# Patient Record
Sex: Male | Born: 1955 | Race: Black or African American | Hispanic: No | Marital: Married | Smoking: Current every day smoker
Health system: Southern US, Community
[De-identification: ages and names within clinical notes are randomized; demographics above are authoritative.]

## PROBLEM LIST (undated history)

## (undated) DIAGNOSIS — K5090 Crohn's disease, unspecified, without complications: Secondary | ICD-10-CM

## (undated) DIAGNOSIS — I10 Essential (primary) hypertension: Secondary | ICD-10-CM

## (undated) DIAGNOSIS — I639 Cerebral infarction, unspecified: Secondary | ICD-10-CM

## (undated) DIAGNOSIS — E785 Hyperlipidemia, unspecified: Secondary | ICD-10-CM

## (undated) HISTORY — PX: OTHER SURGICAL HISTORY: SHX169

## (undated) HISTORY — DX: Hyperlipidemia, unspecified: E78.5

## (undated) HISTORY — DX: Crohn's disease, unspecified, without complications: K50.90

---

## 2000-06-01 ENCOUNTER — Encounter: Payer: Self-pay | Admitting: Internal Medicine

## 2000-06-01 ENCOUNTER — Ambulatory Visit (HOSPITAL_COMMUNITY): Admission: RE | Admit: 2000-06-01 | Discharge: 2000-06-01 | Payer: Self-pay | Admitting: Internal Medicine

## 2000-11-14 ENCOUNTER — Ambulatory Visit (HOSPITAL_COMMUNITY): Admission: RE | Admit: 2000-11-14 | Discharge: 2000-11-14 | Payer: Self-pay | Admitting: General Surgery

## 2003-10-04 ENCOUNTER — Emergency Department (HOSPITAL_COMMUNITY): Admission: EM | Admit: 2003-10-04 | Discharge: 2003-10-04 | Payer: Self-pay | Admitting: Emergency Medicine

## 2003-10-06 ENCOUNTER — Ambulatory Visit (HOSPITAL_COMMUNITY): Admission: RE | Admit: 2003-10-06 | Discharge: 2003-10-06 | Payer: Self-pay | Admitting: Family Medicine

## 2005-07-22 ENCOUNTER — Observation Stay (HOSPITAL_COMMUNITY): Admission: EM | Admit: 2005-07-22 | Discharge: 2005-07-24 | Payer: Self-pay | Admitting: Emergency Medicine

## 2006-02-20 HISTORY — PX: ESOPHAGOGASTRODUODENOSCOPY: SHX1529

## 2006-02-20 HISTORY — PX: COLONOSCOPY: SHX174

## 2006-02-27 ENCOUNTER — Ambulatory Visit: Payer: Self-pay | Admitting: Internal Medicine

## 2006-04-10 ENCOUNTER — Ambulatory Visit: Payer: Self-pay | Admitting: Internal Medicine

## 2006-04-13 ENCOUNTER — Encounter (INDEPENDENT_AMBULATORY_CARE_PROVIDER_SITE_OTHER): Payer: Self-pay | Admitting: Specialist

## 2006-04-13 ENCOUNTER — Ambulatory Visit (HOSPITAL_COMMUNITY): Admission: RE | Admit: 2006-04-13 | Discharge: 2006-04-13 | Payer: Self-pay | Admitting: Internal Medicine

## 2006-04-13 ENCOUNTER — Ambulatory Visit: Payer: Self-pay | Admitting: Internal Medicine

## 2006-04-16 ENCOUNTER — Ambulatory Visit (HOSPITAL_COMMUNITY): Admission: RE | Admit: 2006-04-16 | Discharge: 2006-04-16 | Payer: Self-pay | Admitting: Internal Medicine

## 2006-05-21 ENCOUNTER — Ambulatory Visit: Payer: Self-pay | Admitting: Internal Medicine

## 2006-06-28 ENCOUNTER — Emergency Department (HOSPITAL_COMMUNITY): Admission: EM | Admit: 2006-06-28 | Discharge: 2006-06-28 | Payer: Self-pay | Admitting: Emergency Medicine

## 2006-06-28 ENCOUNTER — Ambulatory Visit: Payer: Self-pay | Admitting: Internal Medicine

## 2008-02-08 IMAGING — CR DG SMALL BOWEL
12 of 23 series · 12 of 23 positions shown · non-contrast
Comparison: none

HISTORY: Crohn's disease

[run (1 of 9)]
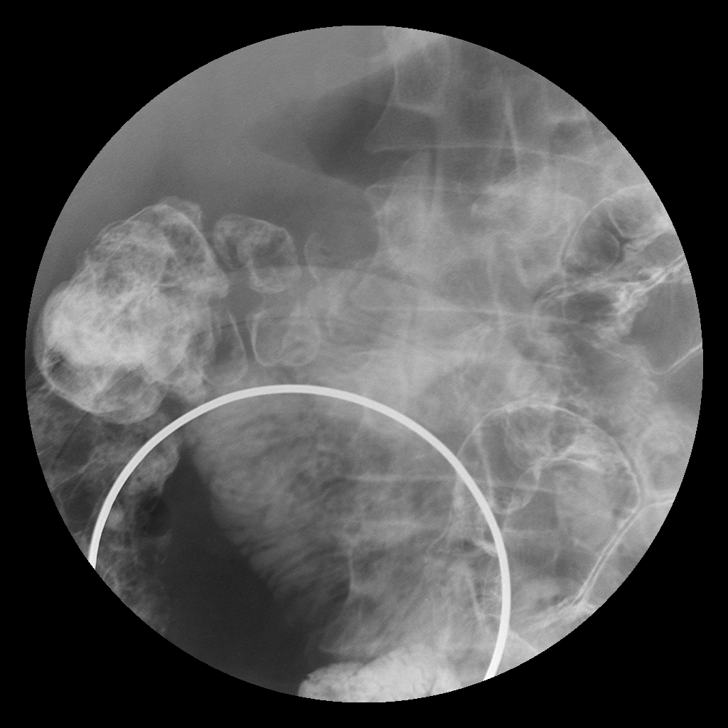

[run (2 of 9)]
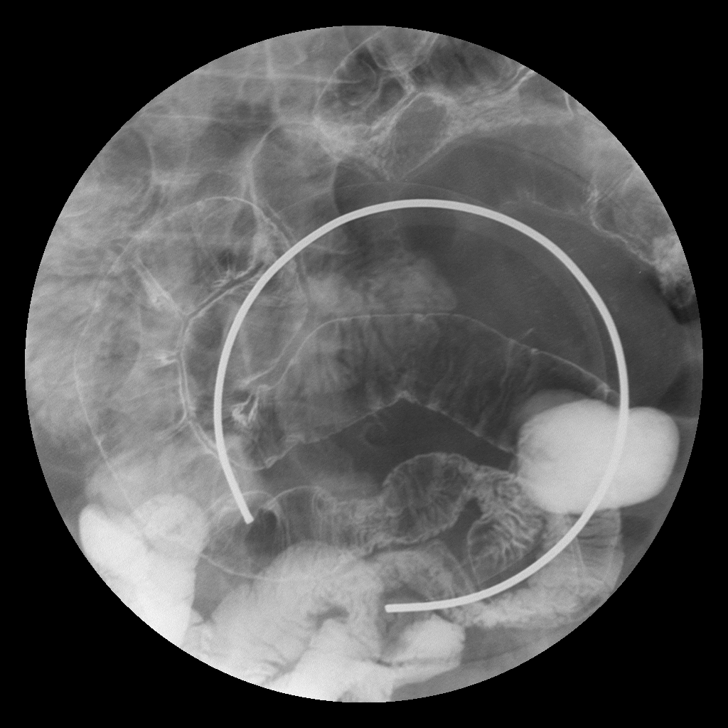

[run (3 of 9)]
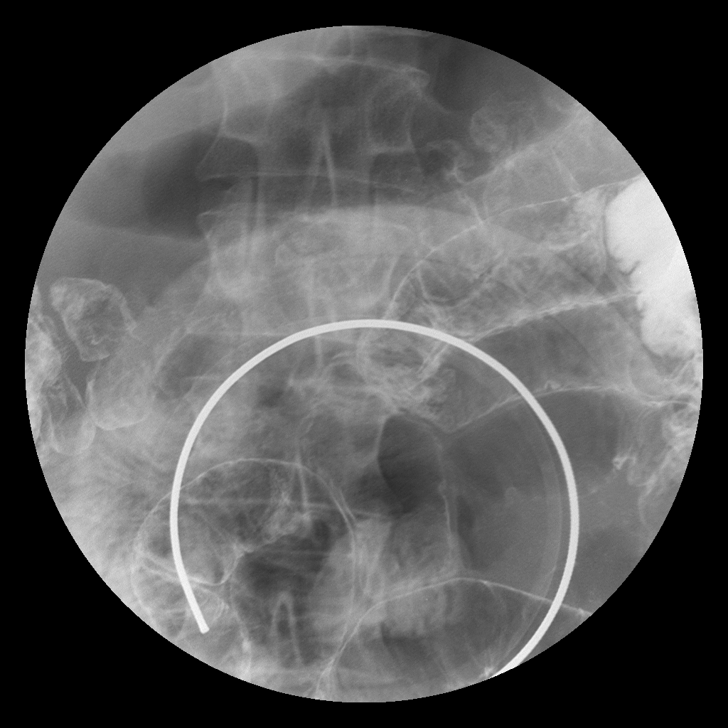

[run (4 of 9)]
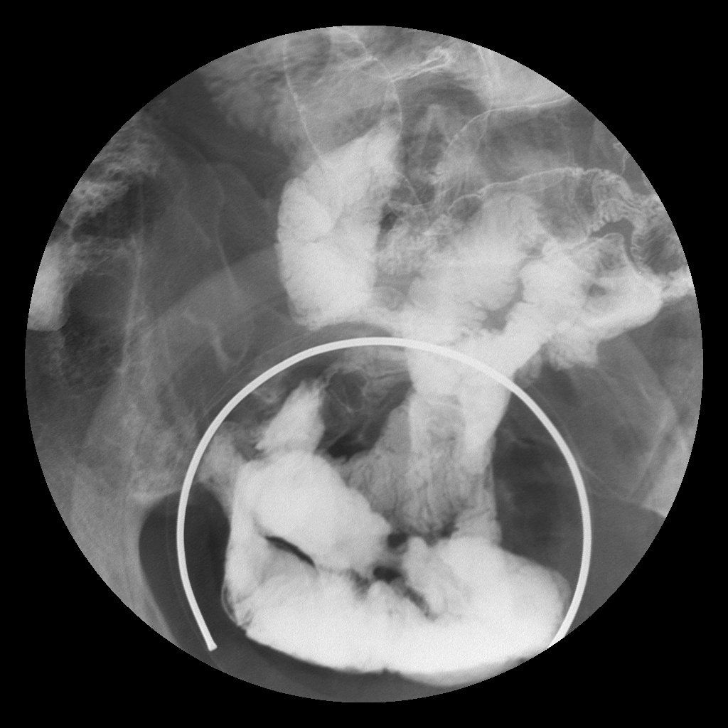

[run (5 of 9)]
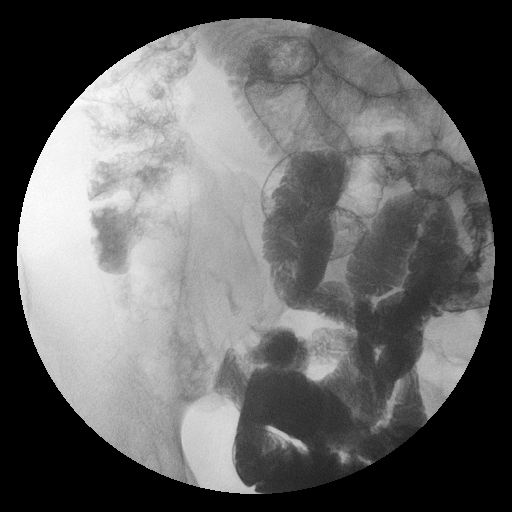

[run (6 of 9)]
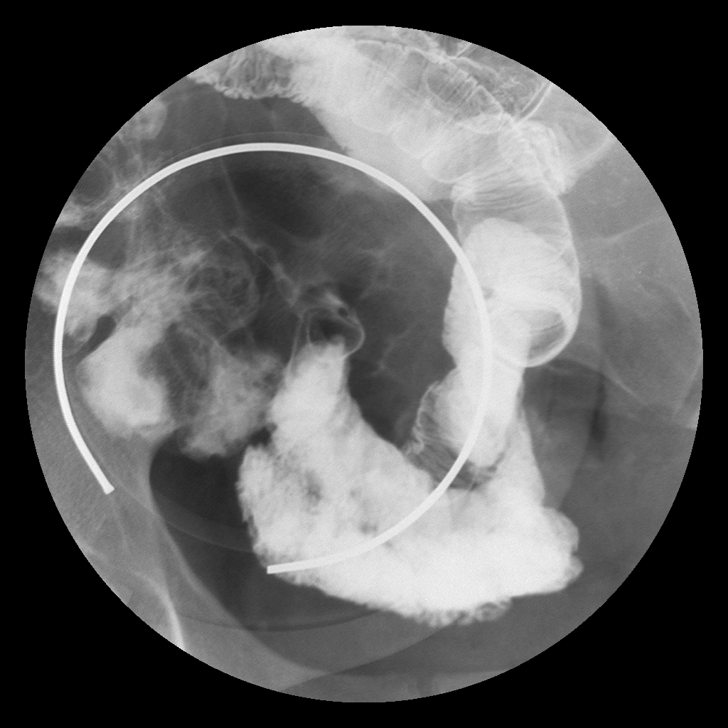

[run (7 of 9)]
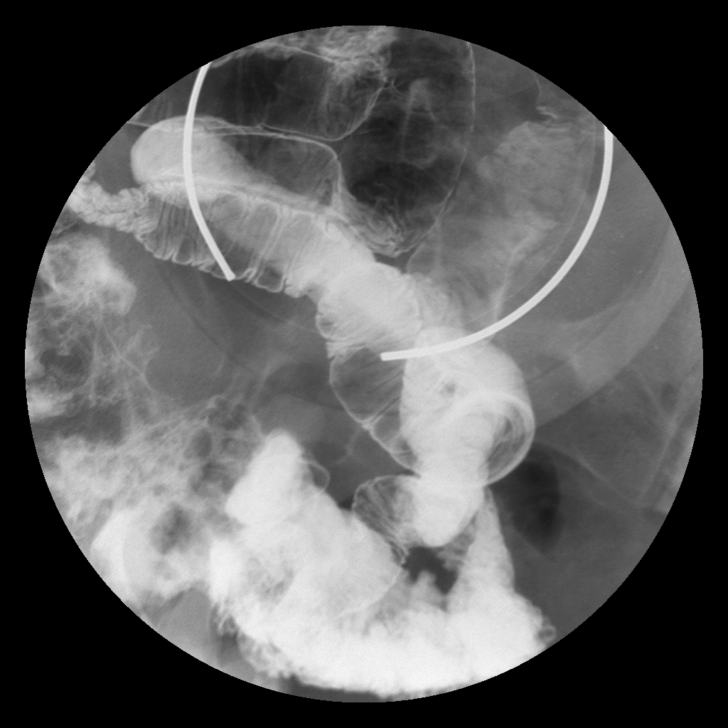

[run (8 of 9)]
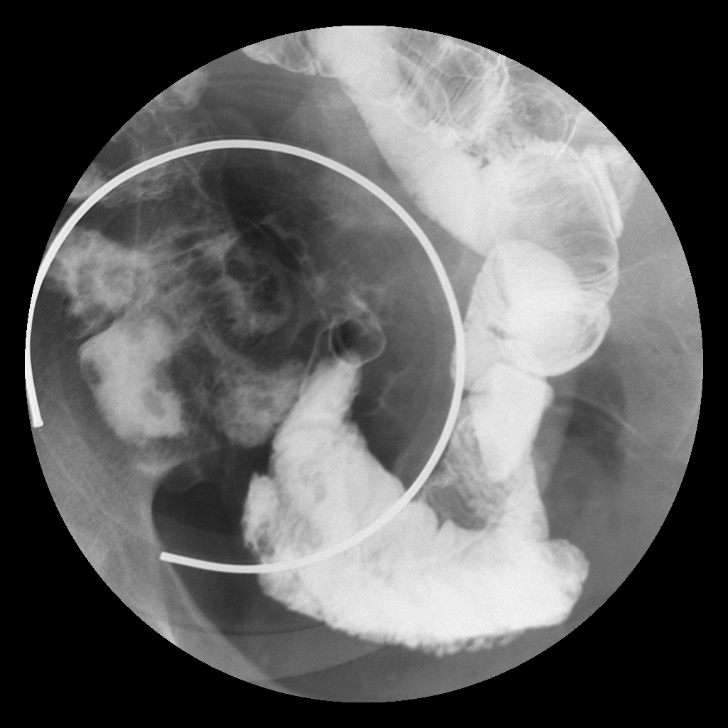

[run (9 of 9)]
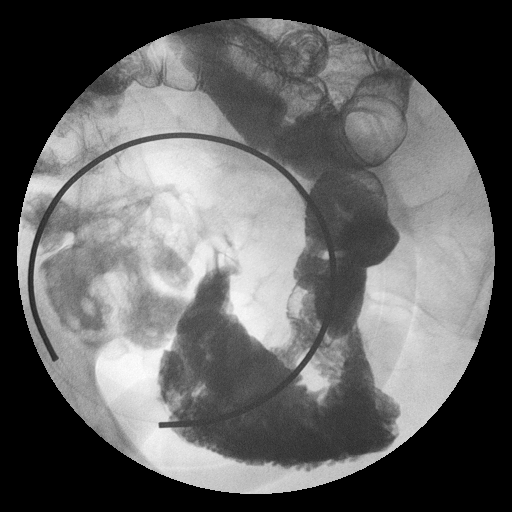

[view not recorded (1 of 3)]
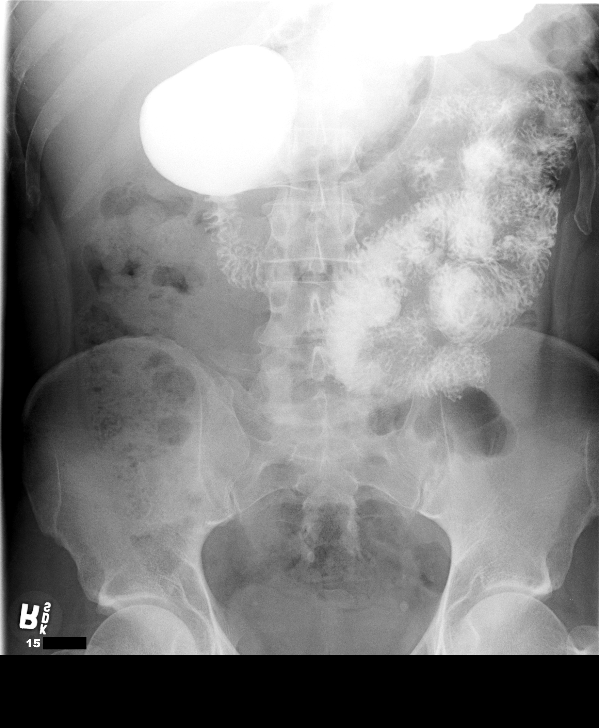

[view not recorded (2 of 3)]
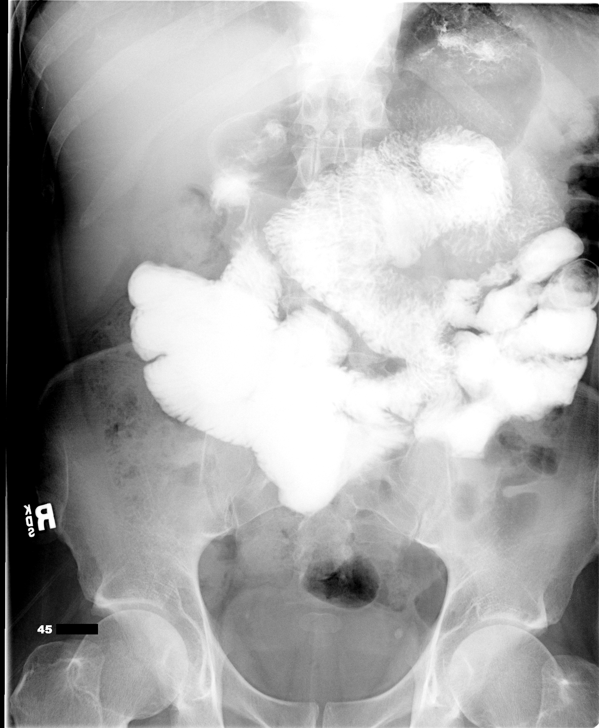

[view not recorded (3 of 3)]
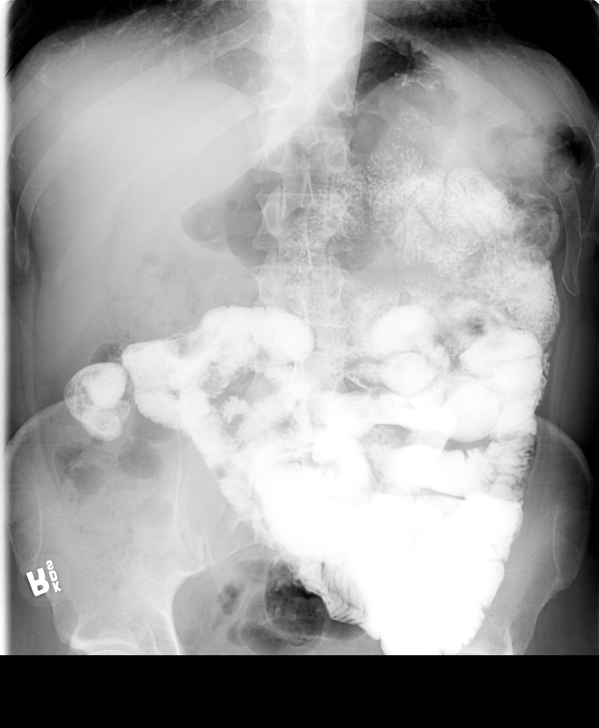

[12 of 23 positions shown; findings below may reference images not displayed]

SMALL BOWEL FOLLOW-THROUGH:

Normal bowel gas pattern on scout image.
Small bilateral pelvic phleboliths.
Stool throughout colon.
No bony abnormality.
Routine small bowel follow-through performed.

Normal transit of contrast through small bowel to colon by 1.5 hours.
No gastric outlet obstruction.
Jejunal and ileal loops predominantly normal in appearance with normal mucosal
fold pattern.
No evidence of obstruction or persistent intraluminal filling defect.
Focal compression views of terminal ileum however, show non distensibility,
narrowing, and slight irregularity of the very terminal ileum over distance of
probably 5 to 7 cm, compatible with involvement by Crohn's disease.
No fistula or extravasation of contrast seen.
Stool noted right colon.
IMPRESSION: Short segment of bowel wall thickening and luminal narrowing at very terminal
ileum compatible with given history of Crohn's disease.
Remainder of small bowel unremarkable

## 2009-08-04 ENCOUNTER — Telehealth (INDEPENDENT_AMBULATORY_CARE_PROVIDER_SITE_OTHER): Payer: Self-pay

## 2009-08-09 DIAGNOSIS — K5090 Crohn's disease, unspecified, without complications: Secondary | ICD-10-CM | POA: Insufficient documentation

## 2009-08-09 DIAGNOSIS — K279 Peptic ulcer, site unspecified, unspecified as acute or chronic, without hemorrhage or perforation: Secondary | ICD-10-CM | POA: Insufficient documentation

## 2009-08-09 DIAGNOSIS — R519 Headache, unspecified: Secondary | ICD-10-CM | POA: Insufficient documentation

## 2009-08-09 DIAGNOSIS — D649 Anemia, unspecified: Secondary | ICD-10-CM

## 2009-08-09 DIAGNOSIS — R51 Headache: Secondary | ICD-10-CM

## 2009-08-09 DIAGNOSIS — F1910 Other psychoactive substance abuse, uncomplicated: Secondary | ICD-10-CM | POA: Insufficient documentation

## 2010-03-22 NOTE — Progress Notes (Signed)
Summary: phone note/ abd pain  Phone Note Call from Patient   Caller: Patient Summary of Call: Pt's wife called and said he is having some abdominal pain (has crohn's ). The pain has been for the last several days now and his entire abdomen hurts constantly. He is not having any n/v or diarrhea. Wanted an urgent appt. or should he go to  the ED? Can be reached @ [phone] or [phone]. Initial call taken by: Cloria Spring LPN,  August 04, 2009 9:39 AM     Appended Document: phone note/ abd pain When was this patient last seen.Marland KitchenMarland KitchenMarland Kitchen?2008? Unfortunately we have no available urgent appts this week. If pain significant, I would recommend f/u with PCP or go to ED if severe. Given no diarrhea, this may not be Crohn's flare. Otherwise, can he come in on [facility] schedule on Tuesday the 21st?  Appended Document: phone note/ abd pain Informed pt's wife. She said he has appt today with Dr. Rosann Auerbach PCP. She is making appt on the 21st with Lorenza Burton, NP, in the event he will need to come here. She will call and cancel if  he does not need it.

## 2010-07-05 NOTE — Assessment & Plan Note (Signed)
NAME:  TERRACE, FONTANILLA                CHART#:  [id number]   DATE:  06/28/2006                       DOB:  1955-08-07   This is an unscheduled visit for Mr. Cobbins with small bowel Crohn  disease.  He was last seen 05/21/2006, really doing well on Pentasa 2  grams daily and Entocort, down now currently to one tablet daily.  He is  not having any diarrhea, minimal abdominal pain.  What brought him here  today is his wife called and made and appointment yesterday because of  two-week history of underlying bold chest pain.  He has had __________  chest pain radiating across his anterior chest wall.  It waxes and  wanes.  He has had it for two weeks now.  Not clearly having any  exertional component.  He has been going to work.  He has had some  associated shortness of breath.  No diaphoresis.  There is no prior  history of coronary disease.  He has not sought out any medical  attention for this aside from coming to see me today.   CURRENT MEDICATIONS:  See updated list.   ALLERGIES:  Penicillin.   PHYSICAL EXAMINATION:  GENERAL:  He appears comfortable.  He is not  diaphoretic.  VITAL SIGNS:  Weight 185, height 6 feet 1 inch, temperature 97.6, BP  140/84, pulse 58.  SKIN:  Warm and dry.  HEENT:  No scleral icterus.  Conjunctivae are pink.  CHEST:  Lungs are clear to auscultation.  CARDIAC:  Regular rate and rhythm without murmur, gallop, rub.  I do not  appreciate any chest wall tenderness to palpation.  ABDOMEN:  Flat, positive bowel sounds.  Soft, nontender.  No appreciable  mass or organomegaly.   ASSESSMENT:  Mr. Jorah Hua has chest pain.  He has had it for two  weeks.  His symptoms are apparently nonspecific but demand further  evaluation now.  Small bowel Crohn disease sounds fairly quiescent.  He  ought to get a __________  and a CBC to check renal function, etc, in  the near future, but for the time being, he is five blocks from [facility].  He has had pain for  two weeks.  He has agreed to get in  his car and go straight to the [facility] Emergency Department.  I have  already called Dr. Estell Harpin the emergency department doctor and apprised  him of the situation and Mr. Schildt's impending arrival there.  We will look forward to hear what the emergency department doctor has to  say and we will get him scheduled back for followup of his Crohn's at  some point in the near future.       Jonathon Bellows, M.D.  Electronically Signed     RMR/MEDQ  D:  06/28/2006  T:  06/28/2006  Job:  [id number]   cc:   Fara Chute

## 2010-07-08 NOTE — Group Therapy Note (Signed)
NAME:  Dean Swanson, Dean Swanson NO.:  [id number]   MEDICAL RECORD NO.:  [id number]          PATIENT TYPE:  OBV   LOCATION:  A336                          FACILITY:  [facility]   PHYSICIAN:  Margaretmary Dys, M.D.DATE OF BIRTH:  August 30, 1955   DATE OF PROCEDURE:  07/23/2005  DATE OF DISCHARGE:                                   PROGRESS NOTE   SUBJECTIVE:  He is doing much better today.  He was actually eating a  regular diet. When I saw him he said  his abdominal pain is mostly gone. He  has no nausea or vomiting. He has no fever or chills. He had good bowel  movement yesterday without any difficulty.   OBJECTIVE:  GENERAL: Conscious, alert, comfortable, not in acute distress.  VITAL SIGNS: Blood pressure 143/81, pulse 72, respirations 20, temperature  98 degrees Fahrenheit.  HEENT: Normocephalic and atraumatic. Oral mucosa moist. No exudates.  NECK: Supple; no JVD.  LUNGS: Clear clinically with good air entry bilaterally.  HEART: S1 and S2 regular. No S3, S4, gallops, or rubs.  ABDOMEN: Obese, but soft, very minimal tenderness which has significantly  improved. Bowel sounds are positive.  EXTREMITIES: No pitting pedal edema. No calf induration or tenderness was  noted.   LABORATORY/DIAGNOSTIC DATA:  White blood cell count 14.1, hemoglobin 12.4,  hematocrit 37.8, platelet count 318,000, eosinophils 92%. Sodium 139,  potassium 4.4, chloride 107, CO2 25, glucose 130, BUN 6, creatinine 1.1,  calcium 9.3.   ASSESSMENT/PLAN:  Acute Crohn's exacerbation. The patient is feeling much  better today. He is on Cipro, Flagyl, and Solu-Medrol. Will continue on that  therapy and switch Solu-Medrol to prednisone. Overall the patient is doing  fairly well. Will continue to advance his diet. If the patient does fairly  well today he may likely go home in the morning.      Margaretmary Dys, M.D.  Electronically Signed     AM/MEDQ  D:  07/23/2005  T:  07/23/2005  Job:  [id number]

## 2010-07-08 NOTE — H&P (Signed)
NAME:  Dean Swanson, Dean Swanson               ACCOUNT NO.:  [id number]   MEDICAL RECORD NO.:  [id number]          PATIENT TYPE:  AMB   LOCATION:  DAY                           FACILITY:  [facility]   PHYSICIAN:  R. Roetta Sessions, M.D. DATE OF BIRTH:  [birth year]   DATE OF ADMISSION:  04/21/2006  DATE OF DISCHARGE:  LH                              HISTORY & PHYSICAL   CHIEF COMPLAINT:  History of Crohn's disease, with a positive family  history of colon cancer, early satiety and weight loss.   HISTORY OF PRESENT ILLNESS:  Dean Swanson is a pleasant 55 year old  gentleman sent over at the courtesy of Dr. Fara Chute, to get  established with the gastroenterologist, given his history of prior  diagnosis of Crohn's disease back in 2002.  He underwent an EGD and  colonoscopy by Dr. Jerolyn Shin C. Katrinka Blazing here.  He was found to have cecal  ulcers and gastric and duodenal ulcerations on EGD.  Biopsies of the  cecum demonstrated granulomas.  He has been on Asacol and prednisone  intermittently over the past several years, but none recently.  He has  developed early satiety and weight loss over the past couple of months.  We saw him originally on February 27, 2006.  At that time we had no  records but since then we have the above-mentioned records from 2002.  It is reported in Dr. Michaelle Copas notes that he has a positive family  history of colon cancer; however, Dean Swanson denies a positive family  history of colon cancer.   A CBC came back completely normal from March 14, 2006.  His vitamin  B12 level was limits of normal in February 1997.   PAST MEDICAL HISTORY:  1. Crohn's by history.  2. Headaches, anemia and distant history of street drug abuse.  3. EGD and colonoscopy as outlined above.   CURRENT MEDICATIONS:  MiraLax p.r.n. constipation.   ALLERGIES:  PENICILLIN, rash.  ASACOL, nausea,   FAMILY HISTORY:  Mother has some nonspecific stomach problems.  She sees  a gastroenterology in [place].  She has  hypertension.  Father is age  56, has heart disease.  There is no family history of colorectal  neoplasia or inflammatory bowel disease.   SOCIAL HISTORY:  The patient is married and has three children.  He  works for [employer] in [place].  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  He smokes 1/2 pack of cigarettes per day.  He has  not consumed any alcohol in the past 10 years.  Prior to that he did  drink fairly heavily and uses cocaine and marijuana, but no parenteral  drugs.   REVIEW OF SYSTEMS:  A 9-pound weight loss since he was seen last months.  He has early satiety.  He has perceived constipation but he typically  only eats one or two mouthfuls of food recently, and is satiated.  No  fevers or chills.  No eye, joint or skin problems.  No chest pain or  dyspnea on exertion.   PHYSICAL EXAMINATION:  GENERAL:  A pleasant 55 year old  gentleman  resting comfortably.  VITAL SIGNS:  Weight 185 pounds, height 6 feet 1 inch, temperature 97.9  degrees, blood pressure 136/74, pulse 68.  SKIN:  Warm and dry.  HEENT:  No scleral icterus.  CHEST:  Lungs are clear to auscultation.  HEART:  A regular rate and rhythm without murmur, gallop or rub.  ABDOMEN:  Non-distended with positive bowel sounds, soft, entirely  nontender without appreciable mass or organomegaly.  EXTREMITIES:  No edema.  RECTAL:  Deferred until time of colonoscopy.   IMPRESSION/PLAN:  Dean Swanson is a pleasant 55 year old gentleman who was  given a diagnosis of Crohn's disease by Dr. Elpidio Anis previously.  Indeed,  in review of the records from 2002, he did have cecal and  proximal duodenal ulcerations and there were granulomas on the biopsies  of the cecum, which certainly makes  Crohn's disease much more likely in  this clinical setting.  He has really developed new symptoms of early  satiety in the setting of weight loss.  These symptoms are concerning  and deserve further evaluation.  To that  end, I have recommended that  Dean Swanson go ahead and have a repeat EGD and colonoscopy in the very  near future and then we can decide about further management options,  once the endoscopic evaluation has been completed.  We discussed the  risks, benefits and alternatives of this approach.  Questions were  answered.  He is agreeable.  Further recommendations to follow.   I would like to thank Dr. Fara Chute again for allowing me to see this  nice gentleman.      Dean Swanson, M.D.  Electronically Signed     RMR/MEDQ  D:  04/10/2006  T:  04/10/2006  Job:  [id number]   cc:   Fara Chute  Fax: [phone]

## 2010-07-08 NOTE — Op Note (Signed)
NAME:  Dean Swanson, Dean Swanson               ACCOUNT NO.:  [id number]   MEDICAL RECORD NO.:  [id number]          PATIENT TYPE:  AMB   LOCATION:  DAY                           FACILITY:  [facility]   PHYSICIAN:  R. Roetta Sessions, M.D. DATE OF BIRTH:  06-22-1955   DATE OF PROCEDURE:  04/13/2006  DATE OF DISCHARGE:                               OPERATIVE REPORT   PROCEDURE:  Esophagogastroduodenoscopy with duodenal biopsy followed by  colonoscopy with biopsy.   INDICATIONS FOR PROCEDURE:  A 55 year old gentleman with a history of  Crohn's disease.  Prior EGD demonstrated duodenal Crohn's as well as  ulceration at the ileocecal valve on prior colonoscopy.  Mr. Dean Swanson is having progressive early satiety, postprandial abdominal pain,  nausea, and weight loss recently.  EGD and colonoscopy are now being  done.  This approach has been discussed with the patient at length.  Potential risks, benefits and alternatives have been reviewed.  Please  see documentation on the medical record.   PROCEDURE NOTE:  O2 saturation, blood pressure, pulses, and respirations  were monitored throughout the entire procedure.  Conscious sedation with  Versed 5 mg IV and Demerol 100 mg IV in divided doses.   INSTRUMENT:  Olympus video chip system.   FINDINGS:  EGD examination of the tubular esophagus revealed no mucosal  abnormalities.  EG junction was easy to traverse.   STOMACH:  Gastric cavity was empty and insufflated well with air.  A  thorough examination of the gastric mucosa, including a retroflexed view  of the proximal stomach and the esophagogastric junction demonstrated a  couple of tiny antral erosions.  The antral and prepyloric mucosa  appeared to be somewhat swollen of the pylorus.  The pyloric channel was  somewhat narrowed, but the remainder of the gastric mucosa appeared  normal.  I transversed the pyloric channel and came into the first part  of the duodenum, which was markedly abnormal.  It was  swollen, eroded,  markedly friable, and there was a significant encroachment on the lumen  with circumferential process extending into the proximal second portion  of the duodenum.  I was able to traverse this area only after applying  moderate pressure through the scope to traverse it.  There were  ulcerations through this segment.  Once I traversed it, there were a  couple of 2-3 mm ulcers in the second portion of the duodenum, but  otherwise mucosa appeared normal and the lumen was wide open, as far as  I could see distally.  Please see photos.   THERAPEUTICS/DIAGNOSTIC MANEUVERS PERFORMED:  The area of abnormal  mucosa straddling D1 and D2 was biopsied multiple times for the  pathologist.  The patient tolerated the procedure well and was prepared  for colonoscopy.   Digital rectal exam revealed no abnormalities.   ENDOSCOPIC FINDINGS:  Prep was adequate.   Examination of the colonic mucosa was undertaken with the scope advanced  from the rectosigmoid junction through the left tranverse and right  colon and the appendiceal orifice and cecum.  The ileocecal valve was  markedly deformed.  It did  not have the typical appearance.  The opening  into the small bowel had a fish mouth appearance and had an elliptical  ulceration spiraling into it from the colonic side.  Please see photos.  This was a small aperture lesion.  It was fixed open to this level, but  I was unable to intubate the terminal ileum.  The ulcerated areas on the  distal ileum were biopsied with cold biopsy forceps.  From this level,  the scope was slowly withdrawn, and all previously mentioned mucosal  surfaces were again seen.  The colonic mucosa appeared normal.  The  scope was pulled down into the rectum, where a thorough examination of  the rectal mucosa included a retroflexed view of the anal verge, was  undertaken.  The rectal mucosa appeared normal.  The patient tolerated  the procedure well and was  reactive.   ENDOSCOPY IMPRESSION:  1. Esophagogastroduodenoscopy:  Normal esophagus.  2. A couple of antral erosions and swollen pre-pyloric mucosa, pyloric      channel.  3. A markedly abnormal bulb and proximal second portion with      significant inflammatory changes producing high-grade partial small      bowel obstruction, as described above.  There were some satellite      ulcerations more distally, otherwise distal to this lesion, the      duodenal mucosa appeared normal.  Multiple biopsies of D1 and D2      were taken.  This area of inflammation likely caused a majority of      the patient's symptoms, early satiety, and weight loss.   COLONOSCOPY FINDINGS:  A normal rectum.  Normal colon to the cecum where  the ileocecal valve appeared markedly abnormal with this fish mouth  opening into the terminal ileum with an elliptical ulceration, as  described in the above biopsy.   Patient's findings were consistent with Crohn's disease.  He has  significant involvement of the proximal duodenum.  He has not been on  any oral therapy for Crohn's disease.   RECOMMENDATIONS:  1. A low residue diet.  2. Begin Entocort EC 9 mg daily.  3. Will go ahead and proceed with a small bowel follow-through to      image the remainder of his intervening small bowel.  We will make      further recommendations in the very near future.      Jonathon Bellows, M.D.  Electronically Signed     RMR/MEDQ  D:  04/13/2006  T:  04/13/2006  Job:  [id number]   cc:   Fara Chute  Fax: [phone]

## 2010-07-08 NOTE — H&P (Signed)
NAME:  Dean Swanson, Dean Swanson               ACCOUNT NO.:  [id number]   MEDICAL RECORD NO.:  [id number]          PATIENT TYPE:  OBV   LOCATION:  A336                          FACILITY:  [facility]   PHYSICIAN:  Margaretmary Dys, M.D.DATE OF BIRTH:  1955/08/30   DATE OF ADMISSION:  07/21/2005  DATE OF DISCHARGE:  LH                                HISTORY & PHYSICAL   ADMISSION DIAGNOSES:  1.  Acute abdominal pain.  2.  Acute Crohn's exacerbation.   PRIMARY CARE PHYSICIAN:  Dirk Dress. Katrinka Blazing, MD.   CHIEF COMPLAINT:  Acute abdominal pain.   HISTORY OF PRESENT ILLNESS:  Mr. Krise is a 55 year old, African-American  male, who presented with a complaint of abdominal pain.  The patient said he  had abdominal pain over the past two to three weeks but has been fairly  intermittent.  He describes it as a general crampiness with some belching  and diffuse.  However, over the past two days, he has developed some  constant upper abdominal pain noted to be sharp.  He also had some nausea,  and vomited only after he was given the contrast for CT scan in the  emergency room.  He has had more constipation than diarrhea.  The patient  apparently had a colonoscopy by Dr. Katrinka Blazing two years ago and was told he has  Crohn's disease.  He said he did not have any symptoms at the time and does  not recollect where the colonoscopy was done.  The patient was apparently  treated with prednisone and Prevacid but has not had any followup with Dr.  Katrinka Blazing ever since.  The patient reported that he has been doing well and has  no trouble.  He has, however, lost about 10 pounds in the last month or so.  He denies any chronic diarrhea.  He has no fever or chills, no headaches,  dizziness or lightheadedness, no frequency, urgency or dysuria, no  hematuria.   Evaluation in the emergency room revealed some abdominal tenderness but more  concerning was the abdominal CT scan, which was suggestive of Crohn's  disease.  The patient had no  evidence of abscess or bowel obstruction.  He  is being admitted now IV fluids and further evaluation.   REVIEW OF SYSTEMS:  A 10-point review of systems is otherwise negative  except as mentioned in the History of Present Illness.   PAST MEDICAL HISTORY:  A history of Crohn's disease two years ago.   ALLERGIES:  He is allergic to PENICILLIN with a rash.   MEDICATIONS:  None.   FAMILY HISTORY:  Positive for coronary artery disease.  Father had a  quadruple bypass two years ago.  He is 15 years old.  Mother has some  hypertension.  No significant history of diabetes or coronary artery  disease.   SOCIAL HISTORY:  The patient does not drink or use illicit drugs.  He is  married and has children.  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  He smokes about a pack of cigarettes a day.   PHYSICAL EXAMINATION:  GENERAL:  Conscious, alert, comfortable, not in acute  distress, well-oriented to time, place, and person.  VITAL SIGNS:  Blood pressure 145/79, pulse of 67, respirations are 16,  temperature 97.8, oxygen saturation 98% on room air.  HEENT:  Normocephalic, atraumatic.  The oral mucosa was moist with no  exudates.  NECK:  Supple, no JVD or lymphadenopathy.  LUNGS:  Clear clinically with good air entry bilaterally.  HEART:  S1 and S2 are regular, no S3, S4, gallops, or rubs.  ABDOMEN:  Soft, was mildly distended.  The patient had some mild vague  tenderness in the epigastric area and left upper quadrant, otherwise was  unremarkable.  Bowel sounds were positive.  EXTREMITIES:  No edema.  CNS EXAM:  Grossly intact with no focal deficits.   The patient had an acute abdominal series with evidence of transverse colon  distention without evidence of bowel obstruction or pneumoperitoneum.  His  CT scan was compatible with Crohn's disease with no abscess or obstruction  noted.   His white blood cell count was 8.5, hemoglobin 12.4, hematocrit 37, platelet  count was 323  with no left shift, ESR of 34.  Sodium 136, potassium 3.8,  chloride of 102, CO2 27, glucose 97, BUN of 12, creatinine 1.2, AST 18, ALT  11, albumin 3.4, calcium of 9, amylase 59, lipase of 27.  Urinalysis was  negative.   ASSESSMENT AND PLAN:  Acute Crohn's exacerbation, although at best very  mild.  The patient has no fever, no white count, no diarrhea.  Will start  him empirically on some prednisone and also Cipro and Flagyl.  Will request  Dr. Katrinka Blazing to see him in consult.  Overall, the patient is fairly stable and  I anticipate that he may be able to go home in the next one to two days.  Will control his pain with Dilaudid p.r.n.  He is mildly dehydrated and will  hydrate him with fluids.      Margaretmary Dys, M.D.  Electronically Signed     AM/MEDQ  D:  07/22/2005  T:  07/22/2005  Job:  [id number]

## 2011-02-21 DIAGNOSIS — I639 Cerebral infarction, unspecified: Secondary | ICD-10-CM

## 2011-02-21 HISTORY — DX: Cerebral infarction, unspecified: I63.9

## 2012-04-19 ENCOUNTER — Emergency Department (HOSPITAL_COMMUNITY)
Admission: EM | Admit: 2012-04-19 | Discharge: 2012-04-19 | Disposition: A | Discharge status: Home / Self Care | Payer: BC Managed Care – PPO | Attending: Emergency Medicine | Admitting: Emergency Medicine

## 2012-04-19 ENCOUNTER — Emergency Department (HOSPITAL_COMMUNITY): Payer: BC Managed Care – PPO

## 2012-04-19 ENCOUNTER — Encounter (HOSPITAL_COMMUNITY): Payer: Self-pay | Admitting: *Deleted

## 2012-04-19 DIAGNOSIS — S0100XA Unspecified open wound of scalp, initial encounter: Secondary | ICD-10-CM | POA: Insufficient documentation

## 2012-04-19 DIAGNOSIS — Z7982 Long term (current) use of aspirin: Secondary | ICD-10-CM | POA: Insufficient documentation

## 2012-04-19 DIAGNOSIS — Y9389 Activity, other specified: Secondary | ICD-10-CM | POA: Insufficient documentation

## 2012-04-19 DIAGNOSIS — Y92410 Unspecified street and highway as the place of occurrence of the external cause: Secondary | ICD-10-CM | POA: Insufficient documentation

## 2012-04-19 DIAGNOSIS — Z8673 Personal history of transient ischemic attack (TIA), and cerebral infarction without residual deficits: Secondary | ICD-10-CM | POA: Insufficient documentation

## 2012-04-19 DIAGNOSIS — I10 Essential (primary) hypertension: Secondary | ICD-10-CM | POA: Insufficient documentation

## 2012-04-19 DIAGNOSIS — F17200 Nicotine dependence, unspecified, uncomplicated: Secondary | ICD-10-CM | POA: Insufficient documentation

## 2012-04-19 HISTORY — DX: Cerebral infarction, unspecified: I63.9

## 2012-04-19 HISTORY — DX: Essential (primary) hypertension: I10

## 2012-04-19 MED ORDER — HYDROCODONE-ACETAMINOPHEN 5-325 MG PO TABS: 2.0000 | ORAL_TABLET | ORAL | Status: DC | PRN
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
## 2019-08-19 ENCOUNTER — Encounter: Payer: Self-pay | Admitting: Internal Medicine

## 2019-10-22 ENCOUNTER — Ambulatory Visit: Payer: Self-pay

## 2019-12-04 ENCOUNTER — Ambulatory Visit (INDEPENDENT_AMBULATORY_CARE_PROVIDER_SITE_OTHER): Payer: Self-pay | Admitting: *Deleted

## 2019-12-04 ENCOUNTER — Other Ambulatory Visit: Payer: Self-pay

## 2019-12-04 VITALS — Ht 73.0 in | Wt 232.0 lb

## 2019-12-04 DIAGNOSIS — Z1211 Encounter for screening for malignant neoplasm of colon: Secondary | ICD-10-CM

## 2019-12-04 MED ORDER — FLEET ENEMA 7-19 GM/118ML RE ENEM: 1.0000 | ENEMA | Freq: Once | RECTAL | 0 refills | Status: DC

## 2019-12-04 MED ORDER — BISACODYL EC 5 MG PO TBEC: 5.0000 mg | DELAYED_RELEASE_TABLET | Freq: Once | ORAL | 0 refills | Status: AC

## 2019-12-04 MED ORDER — PEG 3350-KCL-NA BICARB-NACL 420 G PO SOLR: 4000.0000 mL | Freq: Once | ORAL | 0 refills | Status: DC

## 2019-12-04 MED ORDER — PEG 3350-KCL-NA BICARB-NACL 420 G PO SOLR: 4000.0000 mL | Freq: Once | ORAL | 0 refills | Status: AC

## 2019-12-04 MED ORDER — FLEET ENEMA 7-19 GM/118ML RE ENEM: 1.0000 | ENEMA | Freq: Once | RECTAL | 0 refills | Status: AC

## 2019-12-04 MED ORDER — BISACODYL EC 5 MG PO TBEC: 5.0000 mg | DELAYED_RELEASE_TABLET | Freq: Once | ORAL | 0 refills | Status: DC

## 2019-12-04 NOTE — Patient Instructions (Addendum)
Dean Swanson   [birth year] MRN: [id number]    Procedure Date: 12/31/2019 Time to register: 9:30 Place to register: [facility] Procedure Time: 10:30 Scheduled provider: Dr. Gala Romney  PREPARATION FOR COLONOSCOPY WITH TRI-LYTE SPLIT PREP  Please notify us immediately if you are diabetic, take iron supplements, or if you are on Coumadin or any other blood thinners.   Please hold the following medications: n/a  You will need to purchase 1 fleet enema and 1 box of Bisacodyl $RemoveBefo'5mg'[id number]$  tablets.   2 DAYS BEFORE PROCEDURE:  DATE: 12/29/2019   DAY: Monday Begin clear liquid diet AFTER your lunch meal. NO SOLID FOODS after this point.  1 DAY BEFORE PROCEDURE:  DATE: 12/30/2019   DAY: Tuesday Continue clear liquids the entire day - NO SOLID FOOD.   Diabetic medications adjustments for today: n/a  At 2:00 pm:  Take 2 Bisacodyl tablets.   At 4:00pm:  Start drinking your solution. Make sure you mix well per instructions on the bottle. Try to drink 1 (one) 8 ounce glass every 10-15 minutes until you have consumed HALF the jug. You should complete by 6:00pm.You must keep the left over solution refrigerated until completed next day.  Continue clear liquids. You must drink plenty of clear liquids to prevent dehyration and kidney failure.     DAY OF PROCEDURE:   DATE: 12/31/2019   DAY: Wednesday If you take medications for your heart, blood pressure or breathing, you may take these medications.  Diabetic medications adjustments for today: n/a  Five hours before your procedure time @ 5:30 am:  Finish remaining amout of bowel prep, drinking 1 (one) 8 ounce glass every 10-15 minutes until complete. You have two hours to consume remaining prep.   Three hours before your procedure time @ 7:30 am:  Nothing by mouth.   At least one hour before going to the hospital:  Give yourself one Fleet enema. You may take your morning medications with sip of water unless we have instructed otherwise.       Please see below for Dietary Information.  CLEAR LIQUIDS INCLUDE:  Water Jello (NOT red in color)   Ice Popsicles (NOT red in color)   Tea (sugar ok, no milk/cream) Powdered fruit flavored drinks  Coffee (sugar ok, no milk/cream) Gatorade/ Lemonade/ Kool-Aid  (NOT red in color)   Juice: apple, white grape, white cranberry Soft drinks  Clear bullion, consomme, broth (fat free beef/chicken/vegetable)  Carbonated beverages (any kind)  Strained chicken noodle soup Hard Candy   Remember: Clear liquids are liquids that will allow you to see your fingers on the other side of a clear glass. Be sure liquids are NOT red in color, and not cloudy, but CLEAR.  DO NOT EAT OR DRINK ANY OF THE FOLLOWING:  Dairy products of any kind   Cranberry juice Tomato juice / V8 juice   Grapefruit juice Orange juice     Red grape juice  Do not eat any solid foods, including such foods as: cereal, oatmeal, yogurt, fruits, vegetables, creamed soups, eggs, bread, crackers, pureed foods in a blender, etc.   HELPFUL HINTS FOR DRINKING PREP SOLUTION:   Make sure prep is extremely cold. Mix and refrigerate the the morning of the prep. You may also put in the freezer.   You may try mixing some Crystal Light or Country Time Lemonade if you prefer. Mix in small amounts; add more if necessary.  Try drinking through a straw  Rinse mouth with water or a  mouthwash between glasses, to remove after-taste.  Try sipping on a cold beverage /ice/ popsicles between glasses of prep.  Place a piece of sugar-free hard candy in mouth between glasses.  If you become nauseated, try consuming smaller amounts, or stretch out the time between glasses. Stop for 30-60 minutes, then slowly start back drinking.        OTHER INSTRUCTIONS  You will need a responsible adult at least 64 years of age to accompany you and drive you home. This person must remain in the waiting room during your procedure. The hospital will cancel  your procedure if you do not have a responsible adult with you.   1. Wear loose fitting clothing that is easily removed. 2. Leave jewelry and other valuables at home.  3. Remove all body piercing jewelry and leave at home. 4. Total time from sign-in until discharge is approximately 2-3 hours. 5. You should go home directly after your procedure and rest. You can resume normal activities the day after your procedure. 6. The day of your procedure you should not:  Drive  Make legal decisions  Operate machinery  Drink alcohol  Return to work   You may call the office (Dept: [phone]) before 5:00pm, or page the doctor on call ([phone]) after 5:00pm, for further instructions, if necessary.   Insurance Information YOU WILL NEED TO CHECK WITH YOUR INSURANCE COMPANY FOR THE BENEFITS OF COVERAGE YOU HAVE FOR THIS PROCEDURE.  UNFORTUNATELY, NOT ALL INSURANCE COMPANIES HAVE BENEFITS TO COVER ALL OR PART OF THESE TYPES OF PROCEDURES.  IT IS YOUR RESPONSIBILITY TO CHECK YOUR BENEFITS, HOWEVER, WE WILL BE GLAD TO ASSIST YOU WITH ANY CODES YOUR INSURANCE COMPANY MAY NEED.    PLEASE NOTE THAT MOST INSURANCE COMPANIES WILL NOT COVER A SCREENING COLONOSCOPY FOR PEOPLE UNDER THE AGE OF 50  IF YOU HAVE BCBS INSURANCE, YOU MAY HAVE BENEFITS FOR A SCREENING COLONOSCOPY BUT IF POLYPS ARE FOUND THE DIAGNOSIS WILL CHANGE AND THEN YOU MAY HAVE A DEDUCTIBLE THAT WILL NEED TO BE MET. SO PLEASE MAKE SURE YOU CHECK YOUR BENEFITS FOR A SCREENING COLONOSCOPY AS WELL AS A DIAGNOSTIC COLONOSCOPY.  

## 2019-12-04 NOTE — Progress Notes (Signed)
Gastroenterology Pre-Procedure Review  Request Date: 12/04/2019 Requesting Physician: Dr. Rodena Goldmann @ [facility], Last TCS done 04/13/2006 by Dr. Jena Gauss, findings consistent with Crohn's disease  PATIENT REVIEW QUESTIONS: The patient responded to the following health history questions as indicated:    1. Diabetes Melitis: no 2. Joint replacements in the past 12 months: no 3. Major health problems in the past 3 months: no 4. Has an artificial valve or MVP: no 5. Has a defibrillator: no 6. Has been advised in past to take antibiotics in advance of a procedure like teeth cleaning: no 7. Family history of colon cancer: no  8. Alcohol Use: no 9. Illicit drug Use: no 10. History of sleep apnea: no  11. History of coronary artery or other vascular stents placed within the last 12 months: no 12. History of any prior anesthesia complications: no 13. Body mass index is 30.61 kg/m.    MEDICATIONS & ALLERGIES:    Patient reports the following regarding taking any blood thinners:   Plavix? no Aspirin? yes Coumadin? no Brilinta? no Xarelto? no Eliquis? no Pradaxa? no Savaysa? no Effient? no  Patient confirms/reports the following medications:  Current Outpatient Medications  Medication Sig Dispense Refill  . amLODipine (NORVASC) 5 MG tablet Take 5 mg by mouth daily.    Marland Kitchen aspirin 325 MG EC tablet Take 325 mg by mouth daily.    Marland Kitchen atorvastatin (LIPITOR) 20 MG tablet Take 20 mg by mouth daily.    . hydrochlorothiazide (HYDRODIURIL) 25 MG tablet Take 25 mg by mouth daily.    Marland Kitchen HYDROcodone-acetaminophen (NORCO/VICODIN) 5-325 MG per tablet Take 2 tablets by mouth every 4 (four) hours as needed for pain. (Patient taking differently: Take 2 tablets by mouth daily. ) 10 tablet 0  . loratadine (CLARITIN) 10 MG tablet Take 10 mg by mouth daily.    Marland Kitchen losartan (COZAAR) 100 MG tablet Take 100 mg by mouth daily.    . Multiple Vitamins-Minerals (MULTIVITAMIN MEN 50+ PO) Take by mouth daily.      No current facility-administered medications for this visit.    Patient confirms/reports the following allergies:  Allergies  Allergen Reactions  . Penicillins Hives and Rash    No orders of the defined types were placed in this encounter.   AUTHORIZATION INFORMATION Primary Insurance: Physicist, medical,  ID #: [id number] Pre-Cert / Berkley Harvey required: Yes, authorization on file 10/[id number] Pre-Cert / Auth #: [id number]  SCHEDULE INFORMATION: Procedure has been scheduled as follows:  Date: 12/31/2019, Time: 10:30 Location: [facility] with Dr. Jena Gauss  This Gastroenterology Pre-Precedure Review Form is being routed to the following provider(s): Wynne Dust, NP

## 2019-12-16 NOTE — Progress Notes (Signed)
Will likely need OV for propofol with RMR due to meds

## 2019-12-17 NOTE — Progress Notes (Signed)
Called pt and made him aware that he needs ov to arrange colonoscopy.  Pt made ov for 12/22/2019 at 10:30 with Tana Coast, PA.  Pt aware that we will cancel Covid screening and procedure.  Pt voiced understanding.

## 2019-12-22 ENCOUNTER — Encounter: Payer: Self-pay | Admitting: Gastroenterology

## 2019-12-22 ENCOUNTER — Ambulatory Visit (INDEPENDENT_AMBULATORY_CARE_PROVIDER_SITE_OTHER): Payer: No Typology Code available for payment source | Admitting: Gastroenterology

## 2019-12-22 DIAGNOSIS — K5000 Crohn's disease of small intestine without complications: Secondary | ICD-10-CM | POA: Diagnosis not present

## 2019-12-22 DIAGNOSIS — K5090 Crohn's disease, unspecified, without complications: Secondary | ICD-10-CM | POA: Insufficient documentation

## 2019-12-22 NOTE — Progress Notes (Signed)
No pcp per patient 

## 2019-12-22 NOTE — Patient Instructions (Signed)
Colonoscopy as scheduled. See separate instructions.  

## 2019-12-22 NOTE — Progress Notes (Signed)
Primary Care Physician:   [facility], Dr. Rodena Goldmann  Primary Gastroenterologist:  Roetta Sessions, MD   Chief Complaint  Patient presents with  . Colonoscopy    consult    HPI:  Dean Swanson is a 64 y.o. male here at the request of Dr. Jamey Reas at [facility] for consideration of colonoscopy.  The patient has history of remote EGD/colonoscopy as outlined below. H/o Crohn's with involvement of the proximal duodenal and ileocecal valve. Put on Entocort in 2008 but patient was lost to follow up.   He states he has been doing well. No GI complaints.  Denies unintentional weight loss.  Movement about 2 times daily, often shortly after a meal.  Denies urgency or loose stool.  No melena or rectal bleeding.  No abdominal pain.  Appetite is good.  Denies heartburn, vomiting, dysphagia.  No crohn's meds since we last saw him.   EGD and colonoscopy in February 2008.  Couple of antral erosions and swollen prepyloric mucosa, pyloric channel.  Markedly abnormal bulb and proximal second portion with significant inflammatory changes producing high-grade partial small bowel obstruction.  There were some ulcerations more distally.  On colonoscopy the colon appeared normal to the cecum where the ICV appeared markedly abnormal with fishmouth opening into the TI with elliptical ulceration, TI could not be intubated.  Findings consistent with patient's history of Crohn's disease with involvement of the proximal duodenum and ileocecal valve.  No chronic pain medications.  Hydrocodone previously on his list was from a one-time use for dental issue.    Current Outpatient Medications  Medication Sig Dispense Refill  . amLODipine (NORVASC) 5 MG tablet Take 5 mg by mouth daily.    Marland Kitchen aspirin 325 MG EC tablet Take 325 mg by mouth daily.    Marland Kitchen atorvastatin (LIPITOR) 20 MG tablet Take 20 mg by mouth daily.    . Cholecalciferol (VITAMIN D3) 50 MCG (2000 UT) TABS Take by mouth daily.    . hydrochlorothiazide  (HYDRODIURIL) 25 MG tablet Take 25 mg by mouth daily.    Marland Kitchen loratadine (CLARITIN) 10 MG tablet Take 10 mg by mouth daily.    Marland Kitchen losartan (COZAAR) 100 MG tablet Take 100 mg by mouth daily.    . Multiple Vitamins-Minerals (MULTIVITAMIN MEN 50+ PO) Take by mouth daily.     No current facility-administered medications for this visit.    Allergies as of 12/22/2019 - Review Complete 12/22/2019  Allergen Reaction Noted  . Penicillins Hives and Rash     Past Medical History:  Diagnosis Date  . Crohn's disease (HCC)    Last seen in 2008.  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  Marland Kitchen Hyperlipidemia   . Hypertension   . Stroke [facility]) 2013    Past Surgical History:  Procedure Laterality Date  . COLONOSCOPY  2008   Rourk: Normal-appearing: To the cecum where the ICV appeared markedly abnormal with fishmouth opening into the TI with elliptical ulceration.  TI could not be intubated.  Biopsies consistent with Crohn's.  . ESOPHAGOGASTRODUODENOSCOPY  2008   Rourk: couple of antral erosions and swollen prepyloric mucosa, pyloric channel.  Markedly abnormal bulb and proximal second portion with significant inflammatory changes producing high-grade partial small bowel obstruction.  Some ulcerations distally.  Biopsy somewhat nonspecific but could be related to Crohn's given history  . none      Family History  Problem Relation Age of Onset  . Cancer Brother 35       ?  colon  . Crohn's disease Neg Hx     Social History   Socioeconomic History  . Marital status: Married    Spouse name: Not on file  . Number of children: Not on file  . Years of education: Not on file  . Highest education level: Not on file  Occupational History  . Not on file  Tobacco Use  . Smoking status: Current Every Day Smoker    Packs/day: 0.50  . Smokeless tobacco: Never Used  Substance and Sexual Activity  . Alcohol use: No  . Drug use: Not Currently  . Sexual  activity: Not on file  Other Topics Concern  . Not on file  Social History Narrative  . Not on file   Social Determinants of Health   Financial Resource Strain:   . Difficulty of Paying Living Expenses: Not on file  Food Insecurity:   . Worried About Programme researcher, broadcasting/film/video in the Last Year: Not on file  . Ran Out of Food in the Last Year: Not on file  Transportation Needs:   . Lack of Transportation (Medical): Not on file  . Lack of Transportation (Non-Medical): Not on file  Physical Activity:   . Days of Exercise per Week: Not on file  . Minutes of Exercise per Session: Not on file  Stress:   . Feeling of Stress : Not on file  Social Connections:   . Frequency of Communication with Friends and Family: Not on file  . Frequency of Social Gatherings with Friends and Family: Not on file  . Attends Religious Services: Not on file  . Active Member of Clubs or Organizations: Not on file  . Attends Banker Meetings: Not on file  . Marital Status: Not on file  Intimate Partner Violence:   . Fear of Current or Ex-Partner: Not on file  . Emotionally Abused: Not on file  . Physically Abused: Not on file  . Sexually Abused: Not on file      ROS:  General: Negative for anorexia, weight loss, fever, chills, fatigue, weakness. Eyes: Negative for vision changes.  ENT: Negative for hoarseness, difficulty swallowing , nasal congestion. CV: Negative for chest pain, angina, palpitations, dyspnea on exertion, peripheral edema.  Respiratory: Negative for dyspnea at rest, dyspnea on exertion, cough, sputum, wheezing.  GI: See history of present illness. GU:  Negative for dysuria, hematuria, urinary incontinence, urinary frequency, nocturnal urination.  MS: Negative for joint pain, low back pain.  Derm: Negative for rash or itching.  Neuro: Negative for weakness, abnormal sensation, seizure, frequent headaches, memory loss, confusion.  Psych: Negative for anxiety, depression,  suicidal ideation, hallucinations.  Endo: Negative for unusual weight change.  Heme: Negative for bruising or bleeding. Allergy: Negative for rash or hives.    Physical Examination:  BP (!) 144/75   Pulse 63   Temp (!) 96.9 F (36.1 C) (Temporal)   Ht 6\' 1"  (1.854 m)   Wt 228 lb 9.6 oz (103.7 kg)   BMI 30.16 kg/m    General: Well-nourished, well-developed in no acute distress.  Head: Normocephalic, atraumatic.   Eyes: Conjunctiva pink, no icterus. Mouth: masked Neck: Supple without thyromegaly, masses, or lymphadenopathy.  Lungs: Clear to auscultation bilaterally.  Heart: Regular rate and rhythm, no murmurs rubs or gallops.  Abdomen: Bowel sounds are normal, nontender, nondistended, no hepatosplenomegaly or masses, no abdominal bruits or    hernia , no rebound or guarding.   Rectal: Not performed Extremities: No lower extremity edema. No  clubbing or deformities.  Neuro: Alert and oriented x 4 , grossly normal neurologically.  Skin: Warm and dry, no rash or jaundice.   Psych: Alert and cooperative, normal mood and affect.  Labs: Labs from June 2021: AST 20, ALT 22, total bilirubin 0.6, hemoglobin 14.2, platelets 374,000, white blood cell count 8.66.  Imaging Studies: No results found.  Impression/plan:  Pleasant 64 year old gentleman presenting for consideration of colonoscopy at the request of [place] (Dr. Jamey Reas).  Patient with history of Crohn's disease as outlined above including the proximal small bowel and the ileocecal valve.  Terminal ileum could not be intubated at time of colonoscopy.  Patient was lost to follow-up in 2008.  Clinically doing well.  At this time would offer colonoscopy with possible terminal ileoscopy if possible to evaluate his Crohn's as well as screening for colon cancer.  Given no history of chronic pain medication, no anxiolytics/antidepressants, no alcohol or drug use, previously doing well with conscious sedation we will pursue colonoscopy  with conscious sedation at this time. ASA III.  I have discussed the risks, alternatives, benefits with regards to but not limited to the risk of reaction to medication, bleeding, infection, perforation and the patient is agreeable to proceed. Written consent to be obtained. No plans for EGD at this time given he is asymptomatic.

## 2019-12-29 ENCOUNTER — Other Ambulatory Visit (HOSPITAL_COMMUNITY): Payer: Self-pay

## 2020-01-21 ENCOUNTER — Other Ambulatory Visit (HOSPITAL_COMMUNITY)
Admission: RE | Admit: 2020-01-21 | Discharge: 2020-01-21 | Disposition: A | Discharge status: Home / Self Care | Payer: No Typology Code available for payment source | Source: Ambulatory Visit | Attending: Internal Medicine | Admitting: Internal Medicine

## 2020-01-21 ENCOUNTER — Telehealth: Payer: Self-pay | Admitting: Internal Medicine

## 2020-01-21 ENCOUNTER — Other Ambulatory Visit: Payer: Self-pay

## 2020-01-21 DIAGNOSIS — Z01812 Encounter for preprocedural laboratory examination: Secondary | ICD-10-CM | POA: Insufficient documentation

## 2020-01-21 DIAGNOSIS — Z20822 Contact with and (suspected) exposure to covid-19: Secondary | ICD-10-CM | POA: Diagnosis not present

## 2020-01-21 NOTE — Telephone Encounter (Signed)
Called pt. He states he still has trilyte prep at home he never mixed. He is going to stop by the office this morning and get new instructions.

## 2020-01-21 NOTE — Telephone Encounter (Signed)
[phone] please call patient, he has 2 preps and needs to know what to do for his procedure

## 2020-01-22 LAB — SARS CORONAVIRUS 2 (TAT 6-24 HRS): SARS Coronavirus 2: NEGATIVE

## 2020-01-23 ENCOUNTER — Encounter (HOSPITAL_COMMUNITY)
Admission: RE | Disposition: A | Discharge status: Home / Self Care | Payer: Self-pay | Source: Home / Self Care | Attending: Internal Medicine

## 2020-01-23 ENCOUNTER — Ambulatory Visit (HOSPITAL_COMMUNITY)
Admission: RE | Admit: 2020-01-23 | Discharge: 2020-01-23 | Disposition: A | Discharge status: Home / Self Care | Payer: No Typology Code available for payment source | Attending: Internal Medicine | Admitting: Internal Medicine

## 2020-01-23 ENCOUNTER — Other Ambulatory Visit: Payer: Self-pay

## 2020-01-23 ENCOUNTER — Encounter (HOSPITAL_COMMUNITY): Payer: Self-pay | Admitting: Internal Medicine

## 2020-01-23 DIAGNOSIS — Z8673 Personal history of transient ischemic attack (TIA), and cerebral infarction without residual deficits: Secondary | ICD-10-CM | POA: Diagnosis not present

## 2020-01-23 DIAGNOSIS — K5000 Crohn's disease of small intestine without complications: Secondary | ICD-10-CM | POA: Diagnosis not present

## 2020-01-23 DIAGNOSIS — E785 Hyperlipidemia, unspecified: Secondary | ICD-10-CM | POA: Insufficient documentation

## 2020-01-23 DIAGNOSIS — K635 Polyp of colon: Secondary | ICD-10-CM

## 2020-01-23 DIAGNOSIS — Z7982 Long term (current) use of aspirin: Secondary | ICD-10-CM | POA: Diagnosis not present

## 2020-01-23 DIAGNOSIS — I10 Essential (primary) hypertension: Secondary | ICD-10-CM | POA: Diagnosis not present

## 2020-01-23 DIAGNOSIS — Z809 Family history of malignant neoplasm, unspecified: Secondary | ICD-10-CM | POA: Diagnosis not present

## 2020-01-23 DIAGNOSIS — K621 Rectal polyp: Secondary | ICD-10-CM | POA: Insufficient documentation

## 2020-01-23 DIAGNOSIS — Z79899 Other long term (current) drug therapy: Secondary | ICD-10-CM | POA: Diagnosis not present

## 2020-01-23 DIAGNOSIS — Z880 Allergy status to penicillin: Secondary | ICD-10-CM | POA: Insufficient documentation

## 2020-01-23 DIAGNOSIS — F17200 Nicotine dependence, unspecified, uncomplicated: Secondary | ICD-10-CM | POA: Insufficient documentation

## 2020-01-23 DIAGNOSIS — Z1211 Encounter for screening for malignant neoplasm of colon: Secondary | ICD-10-CM | POA: Insufficient documentation

## 2020-01-23 HISTORY — PX: COLONOSCOPY: SHX5424

## 2020-01-23 HISTORY — PX: POLYPECTOMY: SHX5525

## 2020-01-23 HISTORY — PX: BIOPSY: SHX5522

## 2020-01-23 SURGERY — COLONOSCOPY
Anesthesia: Moderate Sedation

## 2020-01-23 MED ORDER — SODIUM CHLORIDE 0.9 % IV SOLN
INTRAVENOUS | Status: DC
  Administered 2020-01-23: 1000 mL via INTRAVENOUS

## 2020-01-23 MED ORDER — MIDAZOLAM HCL 5 MG/5ML IJ SOLN
INTRAMUSCULAR | Status: AC
  Filled 2020-01-23: qty 10

## 2020-01-23 MED ORDER — STERILE WATER FOR IRRIGATION IR SOLN
Status: DC | PRN
  Administered 2020-01-23: 100 mL

## 2020-01-23 MED ORDER — MEPERIDINE HCL 50 MG/ML IJ SOLN
INTRAMUSCULAR | Status: AC
  Filled 2020-01-23: qty 1

## 2020-01-23 MED ORDER — MIDAZOLAM HCL 5 MG/5ML IJ SOLN
INTRAMUSCULAR | Status: DC | PRN
  Administered 2020-01-23: 1 mg via INTRAVENOUS
  Administered 2020-01-23 (×2): 2 mg via INTRAVENOUS
  Administered 2020-01-23: 1 mg via INTRAVENOUS

## 2020-01-23 MED ORDER — ONDANSETRON HCL 4 MG/2ML IJ SOLN
INTRAMUSCULAR | Status: AC
  Filled 2020-01-23: qty 2

## 2020-01-23 MED ORDER — ONDANSETRON HCL 4 MG/2ML IJ SOLN
INTRAMUSCULAR | Status: DC | PRN
  Administered 2020-01-23: 4 mg via INTRAVENOUS

## 2020-01-23 MED ORDER — MEPERIDINE HCL 100 MG/ML IJ SOLN
INTRAMUSCULAR | Status: DC | PRN
  Administered 2020-01-23 (×2): 25 mg via INTRAVENOUS

## 2020-01-23 NOTE — Op Note (Signed)
[facility] Patient Name: Dean Swanson Procedure Date: 01/23/2020 8:25 AM MRN: [id number] Date of Birth: 07-17-1955 Attending MD: Gennette Pac , MD CSN: [id number] Age: 64 Admit Type: Outpatient Procedure:                Colonoscopy Indications:              Screening for colorectal malignant neoplasm Providers:                Gennette Pac, MD, Sheran Fava, Tammy                            Vaught, RN, Dyann Ruddle Referring MD:              Medicines:                Midazolam 6 mg IV, Meperidine 50 mg IV Complications:            No immediate complications. Estimated Blood Loss:     Estimated blood loss was minimal. Procedure:                Pre-Anesthesia Assessment:                           - Prior to the procedure, a History and Physical                            was performed, and patient medications and                            allergies were reviewed. The patient's tolerance of                            previous anesthesia was also reviewed. The risks                            and benefits of the procedure and the sedation                            options and risks were discussed with the patient.                            All questions were answered, and informed consent                            was obtained. Prior Anticoagulants: The patient has                            taken no previous anticoagulant or antiplatelet                            agents. ASA Grade Assessment: II - A patient with                            mild systemic disease. After reviewing the risks  and benefits, the patient was deemed in                            satisfactory condition to undergo the procedure.                           After obtaining informed consent, the colonoscope                            was passed under direct vision. Throughout the                            procedure, the patient's blood pressure, pulse, and                             oxygen saturations were monitored continuously. The                            CF-[id number] ([id number]) scope was introduced through                            the anus and advanced to the the terminal ileum.                            The colonoscopy was performed without difficulty.                            The patient tolerated the procedure well. The                            quality of the bowel preparation was adequate. Scope In: 8:57:32 AM Scope Out: 9:12:12 AM Scope Withdrawal Time: 0 hours 10 minutes 35 seconds  Total Procedure Duration: 0 hours 14 minutes 40 seconds  Findings:      The perianal and digital rectal examinations were normal.      A 5 mm polyp was found in the mid rectum. The polyp was sessile. The       polyp was removed with a cold snare. Resection and retrieval were       complete. Estimated blood loss was minimal. Abnormal appearing ileocecal       valve and terminal ileum. Ileocecal valve invaginated with abnormal       "fishmouth" lumen. Was able to nose the tip of the scope just into the       distal aspect of the TI. Significantly stenotic lumen; fibrotic and       somewhat inflamed appearing TI mucosa. Biopsies taken. Impression:               - One 5 mm polyp at the mid rectum, removed with a                            cold snare. Resected and retrieved. Normal TI and                            ileocecal valve. Status post biopsy. Moderate Sedation:  Moderate (conscious) sedation was administered by the endoscopy nurse       and supervised by the endoscopist. The following parameters were       monitored: oxygen saturation, heart rate, blood pressure, respiratory       rate, EKG, adequacy of pulmonary ventilation, and response to care.       Total physician intraservice time was 26 minutes. Recommendation:           - Patient has a contact number available for                            emergencies. The signs and symptoms of  potential                            delayed complications were discussed with the                            patient. Return to normal activities tomorrow.                            Written discharge instructions were provided to the                            patient.                           - Advance diet as tolerated today. Further                            recommendations to follow. Procedure Code(s):        --- Professional ---                           [phone], Colonoscopy, flexible; with removal of                            tumor(s), polyp(s), or other lesion(s) by snare                            technique                           99153, Moderate sedation; each additional 15                            minutes intraservice time                           G0500, Moderate sedation services provided by the                            same physician or other qualified health care                            professional performing a gastrointestinal  endoscopic service that sedation supports,                            requiring the presence of an independent trained                            observer to assist in the monitoring of the                            patient's level of consciousness and physiological                            status; initial 15 minutes of intra-service time;                            patient age 73 years or older (additional time may                            be reported with 76226, as appropriate) Diagnosis Code(s):        --- Professional ---                           Z12.11, Encounter for screening for malignant                            neoplasm of colon                           K63.5, Polyp of colon CPT copyright 2019 American [employer] Association. All rights reserved. The codes documented in this report are preliminary and upon coder review may  be revised to meet current compliance requirements. Gerrit Friends. Meekah Math,  MD Gennette Pac, MD 01/23/2020 9:26:58 AM This report has been signed electronically. Number of Addenda: 0

## 2020-01-23 NOTE — Discharge Instructions (Signed)
  Colonoscopy Discharge Instructions  Read the instructions outlined below and refer to this sheet in the next few weeks. These discharge instructions provide you with general information on caring for yourself after you leave the hospital. Your doctor may also give you specific instructions. While your treatment has been planned according to the most current medical practices available, unavoidable complications occasionally occur. If you have any problems or questions after discharge, call Dr. Jena Gauss at [phone]. ACTIVITY  You may resume your regular activity, but move at a slower pace for the next 24 hours.   Take frequent rest periods for the next 24 hours.   Walking will help get rid of the air and reduce the bloated feeling in your belly (abdomen).   No driving for 24 hours (because of the medicine (anesthesia) used during the test).    Do not sign any important legal documents or operate any machinery for 24 hours (because of the anesthesia used during the test).  NUTRITION  Drink plenty of fluids.   You may resume your normal diet as instructed by your doctor.   Begin with a light meal and progress to your normal diet. Heavy or fried foods are harder to digest and may make you feel sick to your stomach (nauseated).   Avoid alcoholic beverages for 24 hours or as instructed.  MEDICATIONS  You may resume your normal medications unless your doctor tells you otherwise.  WHAT YOU CAN EXPECT TODAY  Some feelings of bloating in the abdomen.   Passage of more gas than usual.   Spotting of blood in your stool or on the toilet paper.  IF YOU HAD POLYPS REMOVED DURING THE COLONOSCOPY:  No aspirin products for 7 days or as instructed.   No alcohol for 7 days or as instructed.   Eat a soft diet for the next 24 hours.  FINDING OUT THE RESULTS OF YOUR TEST Not all test results are available during your visit. If your test results are not back during the visit, make an appointment  with your caregiver to find out the results. Do not assume everything is normal if you have not heard from your caregiver or the medical facility. It is important for you to follow up on all of your test results.  SEEK IMMEDIATE MEDICAL ATTENTION IF:  You have more than a spotting of blood in your stool.   Your belly is swollen (abdominal distention).   You are nauseated or vomiting.   You have a temperature over 101.   You have abdominal pain or discomfort that is severe or gets worse throughout the day.   The end of your small intestine appeared abnormal just as it did in 2008.  Biopsies were taken.  Also, you had a small polyp in your colon which was removed  Further recommendations to follow pending review of pathology report  At patient request I called Marylene Land at [phone] -got voicemail.  Left detailed message.

## 2020-01-23 NOTE — H&P (Signed)
@LOGO @   Primary Care Physician:  , MD Primary Gastroenterologist:  Dr. Kirstie Peri  Pre-Procedure History & Physical: HPI:  Dean Swanson is a 64 y.o. male here for screening colonoscopy.  Distant history of ileocolonic Crohn's disease has done very well in 14 years without any medication.  No GI symptoms.  Colonoscopy now being done.  Past Medical History:  Diagnosis Date  . Crohn's disease (HCC)    Last seen in 2008.  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  2009 Hyperlipidemia   . Hypertension   . Stroke [facility]) 2013    Past Surgical History:  Procedure Laterality Date  . COLONOSCOPY  2008   Daiwik [place]: Normal-appearing: To the cecum where the ICV appeared markedly abnormal with fishmouth opening into the TI with elliptical ulceration.  TI could not be intubated.  Biopsies consistent with Crohn's.  . ESOPHAGOGASTRODUODENOSCOPY  2008   Calyn Sivils: couple of antral erosions and swollen prepyloric mucosa, pyloric channel.  Markedly abnormal bulb and proximal second portion with significant inflammatory changes producing high-grade partial small bowel obstruction.  Some ulcerations distally.  Biopsy somewhat nonspecific but could be related to Crohn's given history  . none      Prior to Admission medications   Medication Sig Start Date End Date Taking? Authorizing Provider  amLODipine (NORVASC) 5 MG tablet Take 5 mg by mouth daily.   Yes [provider]  aspirin 325 MG EC tablet Take 325 mg by mouth daily.   Yes [provider]  atorvastatin (LIPITOR) 40 MG tablet Take 40 mg by mouth daily.    Yes [provider]  Cholecalciferol (VITAMIN D3) 125 MCG (5000 UT) TABS Take 5,000 Units by mouth daily.    Yes [provider]  hydrochlorothiazide (HYDRODIURIL) 25 MG tablet Take 25 mg by mouth daily. 10/28/19  Yes [provider]  loratadine (CLARITIN) 10 MG tablet Take 10 mg by mouth daily. 10/16/19  Yes  [provider]  losartan (COZAAR) 100 MG tablet Take 100 mg by mouth daily. 11/12/19  Yes [provider]  Multiple Vitamins-Minerals (MULTIVITAMIN MEN 50+ PO) Take 1 tablet by mouth daily.    Yes [provider]    Allergies as of 12/22/2019 - Review Complete 12/22/2019  Allergen Reaction Noted  . Penicillins Hives and Rash     Family History  Problem Relation Age of Onset  . Cancer Brother 41       ?colon  . Crohn's disease Neg Hx     Social History   Socioeconomic History  . Marital status: Married    Spouse name: Not on file  . Number of children: Not on file  . Years of education: Not on file  . Highest education level: Not on file  Occupational History  . Not on file  Tobacco Use  . Smoking status: Current Every Day Smoker    Packs/day: 0.50  . Smokeless tobacco: Never Used  Vaping Use  . Vaping Use: Never used  Substance and Sexual Activity  . Alcohol use: No  . Drug use: Not Currently  . Sexual activity: Not on file  Other Topics Concern  . Not on file  Social History Narrative  . Not on file   Social Determinants of Health   Financial Resource Strain:   . Difficulty of Paying Living Expenses: Not on file  Food Insecurity:   . Worried About 73 in the Last Year: Not on  file  . Ran Out of Food in the Last Year: Not on file  Transportation Needs:   . Lack of Transportation (Medical): Not on file  . Lack of Transportation (Non-Medical): Not on file  Physical Activity:   . Days of Exercise per Week: Not on file  . Minutes of Exercise per Session: Not on file  Stress:   . Feeling of Stress : Not on file  Social Connections:   . Frequency of Communication with Friends and Family: Not on file  . Frequency of Social Gatherings with Friends and Family: Not on file  . Attends Religious Services: Not on file  . Active Member of Clubs or Organizations: Not on file  . Attends Banker Meetings: Not on file   . Marital Status: Not on file  Intimate Partner Violence:   . Fear of Current or Ex-Partner: Not on file  . Emotionally Abused: Not on file  . Physically Abused: Not on file  . Sexually Abused: Not on file    Review of Systems: See HPI, otherwise negative ROS  Physical Exam: BP (!) 158/85   Pulse 70   Temp 98.2 F (36.8 C) (Oral)   Resp 10   Ht 6\' 1"  (1.854 m)   Wt 102.1 kg   SpO2 98%   BMI 29.69 kg/m  General:   Alert,  Well-developed, well-nourished, pleasant and cooperative in NAD Neck:  Supple; no masses or thyromegaly. No significant cervical adenopathy. Lungs:  Clear throughout to auscultation.   No wheezes, crackles, or rhonchi. No acute distress. Heart:  Regular rate and rhythm; no murmurs, clicks, rubs,  or gallops. Abdomen: Non-distended, normal bowel sounds.  Soft and nontender without appreciable mass or hepatosplenomegaly.  Pulses:  Normal pulses noted. Extremities:  Without clubbing or edema.  Impression/Plan: 64 year old gentleman with history of colonic Crohn's disease diagnosed 14 years ago.  He is done well without symptoms or treatment since that time.  Colonoscopy is now being done The risks, benefits, limitations, alternatives and imponderables have been reviewed with the patient. Questions have been answered. All parties are agreeable.      Notice: This dictation was prepared with Dragon dictation along with smaller phrase technology. Any transcriptional errors that result from this process are unintentional and may not be corrected upon review.

## 2020-01-26 LAB — SURGICAL PATHOLOGY

## 2020-01-27 ENCOUNTER — Encounter: Payer: Self-pay | Admitting: Internal Medicine

## 2020-01-28 ENCOUNTER — Encounter: Payer: Self-pay | Admitting: Internal Medicine

## 2020-01-28 ENCOUNTER — Encounter (HOSPITAL_COMMUNITY): Payer: Self-pay | Admitting: Internal Medicine

## 2020-03-09 ENCOUNTER — Ambulatory Visit: Payer: Non-veteran care | Admitting: Gastroenterology

## 2020-04-14 ENCOUNTER — Ambulatory Visit: Payer: Non-veteran care | Admitting: Gastroenterology

## 2020-04-14 ENCOUNTER — Other Ambulatory Visit: Payer: Self-pay

## 2020-06-22 ENCOUNTER — Telehealth (INDEPENDENT_AMBULATORY_CARE_PROVIDER_SITE_OTHER): Payer: Self-pay | Admitting: *Deleted

## 2020-06-22 NOTE — Telephone Encounter (Signed)
Phone message from 06/18/2020 at 9:25 am  Called patient today to see if I can answer some questions about his TCS he had 01/2020  He keeps getting a bill for the pathology part and he thinks this is covered and probably should have been   He doesn't understand why we do not bill for this part since we did the TCS.  Explained all we take care of is the charges that get filed to insurance and any other labs etc they file.  Asked if he had their # to call their billing office and made sure they had the auth # to attach.  He was going to call and give them this and see where they are billing this.

## 2020-12-25 ENCOUNTER — Emergency Department (HOSPITAL_COMMUNITY): Payer: No Typology Code available for payment source

## 2020-12-25 ENCOUNTER — Other Ambulatory Visit: Payer: Self-pay

## 2020-12-25 ENCOUNTER — Emergency Department (HOSPITAL_COMMUNITY)
Admission: EM | Admit: 2020-12-25 | Discharge: 2020-12-25 | Disposition: A | Discharge status: Home / Self Care | Payer: No Typology Code available for payment source | Attending: Emergency Medicine | Admitting: Emergency Medicine

## 2020-12-25 ENCOUNTER — Encounter (HOSPITAL_COMMUNITY): Payer: Self-pay | Admitting: Emergency Medicine

## 2020-12-25 DIAGNOSIS — R0602 Shortness of breath: Secondary | ICD-10-CM | POA: Diagnosis present

## 2020-12-25 DIAGNOSIS — F17210 Nicotine dependence, cigarettes, uncomplicated: Secondary | ICD-10-CM | POA: Insufficient documentation

## 2020-12-25 DIAGNOSIS — J189 Pneumonia, unspecified organism: Secondary | ICD-10-CM

## 2020-12-25 DIAGNOSIS — I10 Essential (primary) hypertension: Secondary | ICD-10-CM | POA: Insufficient documentation

## 2020-12-25 DIAGNOSIS — J1282 Pneumonia due to coronavirus disease 2019: Secondary | ICD-10-CM | POA: Diagnosis not present

## 2020-12-25 DIAGNOSIS — U071 COVID-19: Secondary | ICD-10-CM | POA: Diagnosis not present

## 2020-12-25 MED ORDER — DOXYCYCLINE HYCLATE 100 MG PO CAPS: 100.0000 mg | ORAL_CAPSULE | Freq: Two times a day (BID) | ORAL | 0 refills | Status: AC

## 2020-12-25 MED ORDER — BENZONATATE 100 MG PO CAPS: 100.0000 mg | ORAL_CAPSULE | Freq: Three times a day (TID) | ORAL | 0 refills | Status: DC

## 2020-12-25 MED ORDER — DOXYCYCLINE HYCLATE 100 MG PO CAPS: 100.0000 mg | ORAL_CAPSULE | Freq: Two times a day (BID) | ORAL | 0 refills | Status: DC

## 2020-12-25 MED ORDER — ALBUTEROL SULFATE HFA 108 (90 BASE) MCG/ACT IN AERS
2.0000 | INHALATION_SPRAY | RESPIRATORY_TRACT | Status: DC | PRN
  Filled 2020-12-25: qty 6.7

## 2020-12-25 NOTE — ED Provider Notes (Signed)
[facility] Emergency Department Provider Note MRN:  [id number]  Arrival date & time: 12/25/20     Chief Complaint   Shortness of Breath   History of Present Illness   Dean Swanson is a 65 y.o. year-old male with a history of hypertension, stroke, Crohn's disease presenting to the ED with chief complaint of shortness of breath.  Persistent cough for 2 weeks.  Diagnosed with COVID-19 2 weeks ago.  Had some dyspnea during the first week or so of illness but this has improved.  Cough seems to be worsening, more productive.  Trouble getting any sleep.  Has inhaler at home that is not helping.  No other medications.  Denies headache or vision change, no chest pain, no abdominal pain, no shortness of breath at this time, no leg pain or swelling.  Review of Systems  A complete 10 system review of systems was obtained and all systems are negative except as noted in the HPI and PMH.   Patient's Health History    Past Medical History:  Diagnosis Date   Crohn's disease ([place])    Last seen in 2008.  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   Hyperlipidemia    Hypertension    Stroke [facility]) 2013    Past Surgical History:  Procedure Laterality Date   BIOPSY  01/23/2020   Procedure: BIOPSY;  Surgeon: Daneil Dolin, MD;  Location: [facility] ENDO SUITE;  Service: Endoscopy;;   COLONOSCOPY  2008   Rourk: Normal-appearing: To the cecum where the ICV appeared markedly abnormal with fishmouth opening into the TI with elliptical ulceration.  TI could not be intubated.  Biopsies consistent with Crohn's.   COLONOSCOPY N/A 01/23/2020   Procedure: COLONOSCOPY;  Surgeon: Daneil Dolin, MD;  Location: [facility] ENDO SUITE;  Service: Endoscopy;  Laterality: N/A;  8:45am   ESOPHAGOGASTRODUODENOSCOPY  2008   Rourk: couple of antral erosions and swollen prepyloric mucosa, pyloric channel.  Markedly abnormal bulb and proximal second portion with  significant inflammatory changes producing high-grade partial small bowel obstruction.  Some ulcerations distally.  Biopsy somewhat nonspecific but could be related to Crohn's given history   none     POLYPECTOMY  01/23/2020   Procedure: POLYPECTOMY;  Surgeon: Daneil Dolin, MD;  Location: [facility]O [facility];  Service: Endoscopy;;    Family History  Problem Relation Age of Onset   Cancer Brother 45       ?colon   Crohn's disease Neg Hx     Social History   Socioeconomic History   Marital status: Married    Spouse name: Not on file   Number of children: Not on file   Years of education: Not on file   Highest education level: Not on file  Occupational History   Not on file  Tobacco Use   Smoking status: Every Day    Packs/day: 0.50    Types: Cigarettes   Smokeless tobacco: Never  Vaping Use   Vaping Use: Never used  Substance and Sexual Activity   Alcohol use: No   Drug use: Not Currently   Sexual activity: Not on file  Other Topics Concern   Not on file  Social History Narrative   Not on file   Social Determinants of Health   Financial Resource Strain: Not on file  Food Insecurity: Not on file  Transportation Needs: Not on file  Physical Activity: Not on file  Stress: Not on file  Social Connections: Not on file  Intimate Partner Violence: Not on file     Physical Exam   Vitals:   12/25/20 0619 12/25/20 0700  BP:  (!) 154/80  Pulse:  74  Resp:  20  Temp:    SpO2: 95% 95%    CONSTITUTIONAL: Well-appearing, NAD NEURO:  Alert and oriented x 3, no focal deficits EYES:  eyes equal and reactive ENT/NECK:  no LAD, no JVD CARDIO: Regular rate, well-perfused, normal S1 and S2 PULM:  CTAB no wheezing or rhonchi GI/GU:  normal bowel sounds, non-distended, non-tender MSK/SPINE:  No gross deformities, no edema SKIN:  no rash, atraumatic PSYCH:  Appropriate speech and behavior  *Additional and/or pertinent findings included in MDM below  Diagnostic and  Interventional Summary    EKG Interpretation  Date/Time:  Saturday December 25 2020 06:08:58 EDT Ventricular Rate:  83 PR Interval:  190 QRS Duration: 98 QT Interval:  381 QTC Calculation: 448 R Axis:   -11 Text Interpretation: Sinus rhythm Borderline T wave abnormalities Confirmed by Kennis Carina [phone]) on 12/25/2020 6:15:26 AM       Labs Reviewed - No data to display  DG Chest Portable 1 View    (Results Pending)    Medications  albuterol (VENTOLIN HFA) 108 (90 Base) MCG/ACT inhaler 2 puff (has no administration in time range)     Procedures  /  Critical Care Procedures  ED Course and Medical Decision Making  I have reviewed the triage vital signs, the nursing notes, and pertinent available records from the EMR.  Listed above are laboratory and imaging tests that I personally ordered, reviewed, and interpreted and then considered in my medical decision making (see below for details).  Considering COVID-19 versus secondary bacterial pneumonia.  No shortness of breath, vital signs normal, awaiting chest x-ray.     Looks like an outflow tract on the chest x-ray, will cover with Doxy, appropriate for discharge.  Elmer Sow. Pilar Plate, MD [facility] Emergency Medicine [facility] [email or web address]  Final Clinical Impressions(s) / ED Diagnoses     ICD-10-CM   1. Community acquired pneumonia, unspecified laterality  J18.9       ED Discharge Orders          Ordered    doxycycline (VIBRAMYCIN) 100 MG capsule  2 times daily        12/25/20 0717    benzonatate (TESSALON) 100 MG capsule  Every 8 hours        12/25/20 0718             Discharge Instructions Discussed with and Provided to Patient:     Discharge Instructions      You were evaluated in the Emergency Department and after careful evaluation, we did not find any emergent condition requiring admission or further testing in the hospital.  Your exam/testing today was overall reassuring.   We are treating you for possible bacterial pneumonia.  Take the doxycycline antibiotic as directed and follow-up closely with your regular doctor.  You can use the Tessalon medication as needed for cough.  Please return to the Emergency Department if you experience any worsening of your condition.  Thank you for allowing [place] to be a part of your care.         Sabas Sous, MD 12/25/20 [phone]

## 2020-12-25 NOTE — Discharge Instructions (Signed)
You were evaluated in the Emergency Department and after careful evaluation, we did not find any emergent condition requiring admission or further testing in the hospital.  Your exam/testing today was overall reassuring.  We are treating you for possible bacterial pneumonia.  Take the doxycycline antibiotic as directed and follow-up closely with your regular doctor.  You can use the Tessalon medication as needed for cough.  Please return to the Emergency Department if you experience any worsening of your condition.  Thank you for allowing [place] to be a part of your care.

## 2020-12-25 NOTE — ED Triage Notes (Signed)
Pt here for c/o sob. States he tested positive for Covid on the 28th of October. Pt states his cough is worse. Cough is productive with yellow phlegm.

## 2022-02-24 ENCOUNTER — Ambulatory Visit
Admission: EM | Admit: 2022-02-24 | Discharge: 2022-02-24 | Disposition: A | Discharge status: Home / Self Care | Payer: No Typology Code available for payment source | Attending: Nurse Practitioner | Admitting: Nurse Practitioner

## 2022-02-24 DIAGNOSIS — Z1152 Encounter for screening for COVID-19: Secondary | ICD-10-CM | POA: Diagnosis present

## 2022-02-24 DIAGNOSIS — J069 Acute upper respiratory infection, unspecified: Secondary | ICD-10-CM | POA: Diagnosis not present

## 2022-02-24 MED ORDER — MOLNUPIRAVIR EUA 200MG CAPSULE: 4.0000 | ORAL_CAPSULE | Freq: Two times a day (BID) | ORAL | 0 refills | Status: AC

## 2022-02-24 MED ORDER — BENZONATATE 100 MG PO CAPS: 100.0000 mg | ORAL_CAPSULE | Freq: Three times a day (TID) | ORAL | 0 refills | Status: AC | PRN

## 2022-02-24 NOTE — ED Provider Notes (Signed)
[facility]    CSN: [id number] Arrival date & time: 02/24/22  1251      History   Chief Complaint Chief Complaint  Patient presents with   Cough    HPI Dean Swanson is a 67 y.o. male.   Patient presents today for 4-day history of fever, night sweats, congested cough, shortness of breath after coughing, nasal congestion, runny nose, postnasal drainage, sore throat and sinus pressure, decreased appetite, and fatigue.  Patient denies wheezing, chest pain or chest tightness, chest congestion, headache, ear pain, abdominal pain, nausea/vomiting, diarrhea, and loss of taste or smell.  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  Has been taking over-the-counter medications such as Coricidin, using hot tea which seem to help temporarily.  Patient denies history of chronic lung disease.  Reports he is a current smoker and has smoked over 40 years, has cut back to half pack per day.    Past Medical History:  Diagnosis Date   Crohn's disease (HCC)    Last seen in 2008.  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  TI could not be intubated.  Biopsies consistent with Crohn's.   COLONOSCOPY N/A 01/23/2020   Procedure: COLONOSCOPY;  Surgeon: Corbin Ade, MD;  [facility];  Service: Endoscopy;   Laterality: N/A;  8:45am   ESOPHAGOGASTRODUODENOSCOPY  2008   Rourk: couple of antral erosions and swollen prepyloric mucosa, pyloric channel.  Markedly abnormal bulb and proximal second portion with significant inflammatory changes producing high-grade partial small bowel obstruction.  Some ulcerations distally.  Biopsy somewhat nonspecific but could be related to Crohn's given history   none     POLYPECTOMY  01/23/2020   Procedure: POLYPECTOMY;  Surgeon: Corbin Ade, MD;  [facility];  Service: Endoscopy;;       Home Medications    Prior to Admission medications   Medication Sig Start Date End Date Taking? Authorizing Provider  molnupiravir EUA (LAGEVRIO) 200 mg CAPS capsule Take 4 capsules (800 mg total) by mouth 2 (two) times daily for 5 days. 02/24/22 03/01/22 Yes Cathlean Marseilles A, NP  amLODipine (NORVASC) 5 MG tablet Take 5 mg by mouth daily.    [provider]  aspirin 325 MG EC tablet Take 325 mg by mouth daily.    [provider]  atorvastatin (LIPITOR) 40 MG tablet Take 40 mg by mouth daily.     [provider]  benzonatate (TESSALON) 100 MG capsule Take 1-2 capsules (100-200 mg total) by mouth 3 (three) times daily as needed for cough. Do not take with alcohol or while driving or operating heavy machinery.  May cause drowsiness. 02/24/22   Valentino Nose, NP  Cholecalciferol (VITAMIN D3) 125 MCG (5000 UT) TABS Take 5,000  Units by mouth daily.     [provider]  hydrochlorothiazide (HYDRODIURIL) 25 MG tablet Take 25 mg by mouth daily. 10/28/19   [provider]  loratadine (CLARITIN) 10 MG tablet Take 10 mg by mouth daily. 10/16/19   [provider]  losartan (COZAAR) 100 MG tablet Take 100 mg by mouth daily. 11/12/19   [provider]  Multiple Vitamins-Minerals (MULTIVITAMIN MEN 50+ PO) Take 1 tablet by mouth daily.     [provider]    Family History Family History  Problem Relation Age  of Onset   Cancer Brother 88       ?colon   Crohn's disease Neg Hx     Social History Social History   Tobacco Use   Smoking status: Every Day    Packs/day: 0.50    Types: Cigarettes   Smokeless tobacco: Never  Vaping Use   Vaping Use: Never used  Substance Use Topics   Alcohol use: No   Drug use: Not Currently     Allergies   Penicillins   Review of Systems Review of Systems Per HPI  Physical Exam Triage Vital Signs ED Triage Vitals  Enc Vitals Group     BP 02/24/22 1331 (!) 153/73     Pulse Rate 02/24/22 1331 75     Resp 02/24/22 1331 (!) 22     Temp 02/24/22 1331 98.2 F (36.8 C)     Temp Source 02/24/22 1331 Oral     SpO2 02/24/22 1331 95 %     Weight --      Height --      Head Circumference --      Peak Flow --      Pain Score 02/24/22 1330 0     Pain Loc --      Pain Edu? --      Excl. in [place]? --    No data found.  Updated Vital Signs BP (!) 153/73 (BP Location: Right Arm)   Pulse 75   Temp 98.2 F (36.8 C) (Oral)   Resp (!) 22   SpO2 95%   Visual Acuity Right Eye Distance:   Left Eye Distance:   Bilateral Distance:    Right Eye Near:   Left Eye Near:    Bilateral Near:     Physical Exam Vitals and nursing note reviewed.  Constitutional:      General: He is not in acute distress.    Appearance: Normal appearance. He is not ill-appearing or toxic-appearing.  HENT:     Head: Normocephalic and atraumatic.     Right Ear: Tympanic membrane, ear canal and external ear normal.     Left Ear: Tympanic membrane, ear canal and external ear normal.     Nose: Congestion present. No rhinorrhea.     Mouth/Throat:     Mouth: Mucous membranes are moist.     Pharynx: Oropharynx is clear. Posterior oropharyngeal erythema present. No oropharyngeal exudate.  Eyes:     General: No scleral icterus.    Extraocular Movements: Extraocular movements intact.  Cardiovascular:     Rate and Rhythm: Normal rate and regular rhythm.  Pulmonary:     Effort:  Pulmonary effort is normal. No respiratory distress.     Breath sounds: Normal breath sounds. No wheezing, rhonchi or rales.  Abdominal:     General: Abdomen is flat. Bowel sounds are normal. There is no distension.     Palpations: Abdomen is soft.     Tenderness: There  is no abdominal tenderness.  Musculoskeletal:     Cervical back: Normal range of motion and neck supple.  Lymphadenopathy:     Cervical: Cervical adenopathy present.  Skin:    General: Skin is warm and dry.     Coloration: Skin is not jaundiced or pale.     Findings: No erythema or rash.  Neurological:     Mental Status: He is alert and oriented to person, place, and time.  Psychiatric:        Behavior: Behavior is cooperative.      UC Treatments / Results  Labs (all labs ordered are listed, but only abnormal results are displayed) Labs Reviewed  SARS CORONAVIRUS 2 (TAT 6-24 HRS)    EKG   Radiology No results found.  Procedures Procedures (including critical care time)  Medications Ordered in UC Medications - No data to display  Initial Impression / Assessment and Plan / UC Course  I have reviewed the triage vital signs and the nursing notes.  Pertinent labs & imaging results that were available during my care of the patient were reviewed by me and considered in my medical decision making (see chart for details).   Patient is well-appearing, afebrile, not tachycardic, oxygenating well on room air.  He is mildly hypertensive today in urgent care, likely secondary to acute illness. Is also slightly tachypneic, however this resolves as we are sitting talking.  Viral URI with cough Encounter for screening for COVID-19 Suspect viral etiology COVID-19 testing performed Will give prescription for molnupiravir if he tests positive; no recent kidney function documented and he needs to start treatment by tomorrow to be in window   Other supportive care discussed including starting Tessalon Perles ER and  return precautions discussed with patient Note given for work  The patient was given the opportunity to ask questions.  All questions answered to their satisfaction.  The patient is in agreement to this plan.    Final Clinical Impressions(s) / UC Diagnoses   Final diagnoses:  Viral URI with cough  Encounter for screening for COVID-19     Discharge Instructions      You have a viral upper respiratory infection.  We have tested you today for COVID-19.  You will see the results in Mychart and we will call you with positive results.  If your results shows positive, please start Molnupiravir at that time.  Please stay home and isolate until you are aware of the results.    Symptoms should improve over the next week to 10 days.  If you develop chest pain or shortness of breath, go to the emergency room.  Some things that can make you feel better are: - Increased rest - Increasing fluid with water/sugar free electrolytes - Acetaminophen as needed for fever/pain - Salt water gargling, chloraseptic spray and throat lozenges for sore throat - OTC guaifenesin (Mucinex) 600 mg twice daily for congestion - Saline sinus flushes or a neti pot for congestion - Humidifying the air -Tessalon Perles every 8 hours as needed for dry cough     ED Prescriptions     Medication Sig Dispense Auth. Provider   benzonatate (TESSALON) 100 MG capsule Take 1-2 capsules (100-200 mg total) by mouth 3 (three) times daily as needed for cough. Do not take with alcohol or while driving or operating heavy machinery.  May cause drowsiness. 30 capsule Cathlean Marseilles A, NP   molnupiravir EUA (LAGEVRIO) 200 mg CAPS capsule Take 4 capsules (800 mg total) by mouth 2 (  two) times daily for 5 days. 40 capsule Eulogio Bear, NP      PDMP not reviewed this encounter.   Eulogio Bear, NP 02/24/22 [phone]

## 2022-02-24 NOTE — Discharge Instructions (Signed)
You have a viral upper respiratory infection.  We have tested you today for COVID-19.  You will see the results in Mychart and we will call you with positive results.  If your results shows positive, please start Molnupiravir at that time.  Please stay home and isolate until you are aware of the results.    Symptoms should improve over the next week to 10 days.  If you develop chest pain or shortness of breath, go to the emergency room.  Some things that can make you feel better are: - Increased rest - Increasing fluid with water/sugar free electrolytes - Acetaminophen as needed for fever/pain - Salt water gargling, chloraseptic spray and throat lozenges for sore throat - OTC guaifenesin (Mucinex) 600 mg twice daily for congestion - Saline sinus flushes or a neti pot for congestion - Humidifying the air -Tessalon Perles every 8 hours as needed for dry cough

## 2022-02-24 NOTE — ED Triage Notes (Signed)
Pt reports cough, congestion x 4 days. OTC meds gives no relief.

## 2022-02-25 LAB — SARS CORONAVIRUS 2 (TAT 6-24 HRS): SARS Coronavirus 2: NEGATIVE

## 2022-07-22 ENCOUNTER — Ambulatory Visit (INDEPENDENT_AMBULATORY_CARE_PROVIDER_SITE_OTHER): Payer: No Typology Code available for payment source

## 2022-07-22 ENCOUNTER — Ambulatory Visit
Admission: EM | Admit: 2022-07-22 | Discharge: 2022-07-22 | Disposition: A | Discharge status: Home / Self Care | Payer: No Typology Code available for payment source | Attending: Nurse Practitioner | Admitting: Nurse Practitioner

## 2022-07-22 DIAGNOSIS — S61213A Laceration without foreign body of left middle finger without damage to nail, initial encounter: Secondary | ICD-10-CM | POA: Diagnosis not present

## 2022-07-22 DIAGNOSIS — Z23 Encounter for immunization: Secondary | ICD-10-CM

## 2022-07-22 MED ORDER — TETANUS-DIPHTH-ACELL PERTUSSIS 5-2.5-18.5 LF-MCG/0.5 IM SUSY
0.5000 mL | PREFILLED_SYRINGE | Freq: Once | INTRAMUSCULAR | Status: AC
  Administered 2022-07-22: 0.5 mL via INTRAMUSCULAR

## 2022-07-22 NOTE — ED Provider Notes (Signed)
[facility]    CSN: [id number] Arrival date & time: 07/22/22  8295      History   Chief Complaint Chief Complaint  Patient presents with   Laceration    HPI Dean Swanson is a 67 y.o. male.   Patient presents today with finger laceration to left middle finger.  Reports he was cutting hedges yesterday around 6 PM while wearing gloves and the hedge trimer "cut" him.  He endorses pain in the finger, however no numbness or tingling.  No drainage from the area.  He applied peroxide and clean the area prior to arrival.  Patient is unsure of last tetanus shot.    Past Medical History:  Diagnosis Date   Crohn's disease (HCC)    Last seen in 2008.  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   Hyperlipidemia    Hypertension    Stroke [facility]) 2013    Patient Active Problem List   Diagnosis Date Noted   Crohn's disease (HCC)    ANEMIA 08/09/2009   SUBSTANCE ABUSE, MULTIPLE 08/09/2009   PUD 08/09/2009   CROHN'S DISEASE 08/09/2009   HEADACHE 08/09/2009    Past Surgical History:  Procedure Laterality Date   BIOPSY  01/23/2020   Procedure: BIOPSY;  Surgeon: Corbin Ade, MD;  [facility];  Service: Endoscopy;;   COLONOSCOPY  2008   Rourk: Normal-appearing: To the cecum where the ICV appeared markedly abnormal with fishmouth opening into the TI with elliptical ulceration.  TI could not be intubated.  Biopsies consistent with Crohn's.   COLONOSCOPY N/A 01/23/2020   Procedure: COLONOSCOPY;  Surgeon: Corbin Ade, MD;  [facility];  Service: Endoscopy;  Laterality: N/A;  8:45am   ESOPHAGOGASTRODUODENOSCOPY  2008   Rourk: couple of antral erosions and swollen prepyloric mucosa, pyloric channel.  Markedly abnormal bulb and proximal second portion with significant inflammatory changes producing high-grade partial small bowel obstruction.  Some ulcerations distally.  Biopsy somewhat nonspecific but could  be related to Crohn's given history   none     POLYPECTOMY  01/23/2020   Procedure: POLYPECTOMY;  Surgeon: Corbin Ade, MD;  [facility];  Service: Endoscopy;;       Home Medications    Prior to Admission medications   Medication Sig Start Date End Date Taking? Authorizing Provider  amLODipine (NORVASC) 5 MG tablet Take 5 mg by mouth daily.    [provider]  aspirin 325 MG EC tablet Take 325 mg by mouth daily.    [provider]  atorvastatin (LIPITOR) 40 MG tablet Take 40 mg by mouth daily.     [provider]  benzonatate (TESSALON) 100 MG capsule Take 1-2 capsules (100-200 mg total) by mouth 3 (three) times daily as needed for cough. Do not take with alcohol or while driving or operating heavy machinery.  May cause drowsiness. 02/24/22   Valentino Nose, NP  Cholecalciferol (VITAMIN D3) 125 MCG (5000 UT) TABS Take 5,000 Units by mouth daily.     [provider]  hydrochlorothiazide (HYDRODIURIL) 25 MG tablet Take 25 mg by mouth daily. 10/28/19   [provider]  loratadine (CLARITIN) 10 MG tablet Take 10 mg by mouth daily. 10/16/19   [provider]  losartan (COZAAR) 100 MG tablet Take 100 mg by mouth daily. 11/12/19   [provider]  Multiple Vitamins-Minerals (MULTIVITAMIN MEN 50+ PO) Take 1 tablet by mouth daily.  [provider]    Family History Family History  Problem Relation Age of Onset   Cancer Brother 96       ?colon   Crohn's disease Neg Hx     Social History Social History   Tobacco Use   Smoking status: Every Day    Packs/day: .5    Types: Cigarettes   Smokeless tobacco: Never  Vaping Use   Vaping Use: Never used  Substance Use Topics   Alcohol use: No   Drug use: Not Currently     Allergies   Penicillins   Review of Systems Review of Systems Per HPI  Physical Exam Triage Vital Signs ED Triage Vitals  Enc Vitals Group     BP 07/22/22 0816 (!) 147/74      Pulse Rate 07/22/22 0816 74     Resp 07/22/22 0816 16     Temp 07/22/22 0816 97.6 F (36.4 C)     Temp Source 07/22/22 0816 Oral     SpO2 07/22/22 0816 95 %     Weight --      Height --      Head Circumference --      Peak Flow --      Pain Score 07/22/22 0817 7     Pain Loc --      Pain Edu? --      Excl. in [place]? --    No data found.  Updated Vital Signs BP (!) 147/74 (BP Location: Right Arm)   Pulse 74   Temp 97.6 F (36.4 C) (Oral)   Resp 16   SpO2 95%   Visual Acuity Right Eye Distance:   Left Eye Distance:   Bilateral Distance:    Right Eye Near:   Left Eye Near:    Bilateral Near:     Physical Exam Vitals and nursing note reviewed.  Constitutional:      General: He is not in acute distress.    Appearance: Normal appearance. He is not toxic-appearing.  HENT:     Head: Normocephalic and atraumatic.     Mouth/Throat:     Mouth: Mucous membranes are moist.     Pharynx: Oropharynx is clear.  Pulmonary:     Effort: Pulmonary effort is normal. No respiratory distress.  Musculoskeletal:       Hands:     Comments: U-shaped laceration to left lateral distal middle phalange without involvement of the nail.  Wound is well-approximated.  Patient is neurovascularly intact distal to the wound.  Skin:    General: Skin is warm and dry.     Capillary Refill: Capillary refill takes less than 2 seconds.     Findings: Laceration present.  Neurological:     Mental Status: He is alert and oriented to person, place, and time.  Psychiatric:        Behavior: Behavior is cooperative.      UC Treatments / Results  Labs (all labs ordered are listed, but only abnormal results are displayed) Labs Reviewed - No data to display  EKG   Radiology DG Finger Middle Left  Result Date: 07/22/2022 CLINICAL DATA:  Left middle finger laceration. EXAM: LEFT MIDDLE FINGER 2+V COMPARISON:  None Available. FINDINGS: No fracture.  No bone lesion. Joints normally spaced and aligned.  Soft tissue injury noted to the radial tip of the left middle finger. No radiopaque foreign body. IMPRESSION: No fracture, dislocation or radiopaque foreign body. Electronically Signed   By: Renard Hamper.D.  On: 07/22/2022 09:04    Procedures Laceration Repair  Date/Time: 07/22/2022 10:10 AM  Performed by: Valentino Nose, NP Authorized by: Valentino Nose, NP   Consent:    Consent obtained:  Verbal   Consent given by:  Patient   Risks, benefits, and alternatives were discussed: yes     Risks discussed:  Infection, pain and poor wound healing   Alternatives discussed:  Observation Universal protocol:    Procedure explained and questions answered to patient or proxy's satisfaction: yes     Patient identity confirmed:  Verbally with patient Anesthesia:    Anesthesia method:  None Laceration details:    Location:  Finger   Finger location:  L long finger   Length (cm):  2 Pre-procedure details:    Preparation:  Imaging obtained to evaluate for foreign bodies Exploration:    Hemostasis achieved with:  Direct pressure   Imaging obtained: x-ray     Imaging outcome: foreign body not noted     Wound exploration: wound explored through full range of motion   Treatment:    Area cleansed with:  Chlorhexidine   Amount of cleaning:  Standard Skin repair:    Repair method:  Tissue adhesive Approximation:    Approximation:  Close Repair type:    Repair type:  Simple Post-procedure details:    Dressing:  Splint for protection   Procedure completion:  Tolerated well, no immediate complications  (including critical care time)  Medications Ordered in UC Medications  Tdap (BOOSTRIX) injection 0.5 mL (0.5 mLs Intramuscular Given 07/22/22 0837)    Initial Impression / Assessment and Plan / UC Course  I have reviewed the triage vital signs and the nursing notes.  Pertinent labs & imaging results that were available during my care of the patient were reviewed by me and considered  in my medical decision making (see chart for details).   Patient is well-appearing, normotensive, afebrile, not tachycardic, not tachypneic, oxygenating well on room air.    1. Laceration of left middle finger without foreign body without damage to nail, initial encounter X-ray imaging today is negative for fracture or radiopaque foreign body Wound was cleaned with chlorhexidine Wound was closed with Dermabond Splint applied to help protect skin Tdap updated Wound care discussed Seek care with signs or symptoms of infection if they develop  The patient was given the opportunity to ask questions.  All questions answered to their satisfaction.  The patient is in agreement to this plan.    Final Clinical Impressions(s) / UC Diagnoses   Final diagnoses:  Laceration of left middle finger without foreign body without damage to nail, initial encounter     Discharge Instructions      The x-ray today does not show any broken bones in your finger  We have updated your Tdap today.  The wound was closed with skin glue.  This will likely fall off in a few days. As the skin heals.  You can wear the finger splint to help protect the skin/dermabond for the next 24 hours or so.  You do not need to keep the wound covered.    Follow up here if you develop more pain/swelling or redness/drainage from the wound.    ED Prescriptions   None    PDMP not reviewed this encounter.   Valentino Nose, NP 07/22/22 1012

## 2022-07-22 NOTE — ED Triage Notes (Signed)
Pt states he cut his left middle finger yesterday on a hedge trimmer.  Laceration noted to top of left middle finger bleeding controlled. Patient unsure of last tetanus.

## 2022-07-22 NOTE — Discharge Instructions (Addendum)
The x-ray today does not show any broken bones in your finger  We have updated your Tdap today.  The wound was closed with skin glue.  This will likely fall off in a few days. As the skin heals.  You can wear the finger splint to help protect the skin/dermabond for the next 24 hours or so.  You do not need to keep the wound covered.    Follow up here if you develop more pain/swelling or redness/drainage from the wound.
